# Patient Record
Sex: Male | Born: 1965 | Hispanic: No | Marital: Married | Smoking: Never smoker
Health system: Southern US, Community
[De-identification: ages and names within clinical notes are randomized; demographics above are authoritative.]

## PROBLEM LIST (undated history)

## (undated) DIAGNOSIS — T7840XA Allergy, unspecified, initial encounter: Secondary | ICD-10-CM

## (undated) DIAGNOSIS — M109 Gout, unspecified: Secondary | ICD-10-CM

## (undated) DIAGNOSIS — M1990 Unspecified osteoarthritis, unspecified site: Secondary | ICD-10-CM

## (undated) DIAGNOSIS — Z9289 Personal history of other medical treatment: Secondary | ICD-10-CM

## (undated) DIAGNOSIS — R079 Chest pain, unspecified: Secondary | ICD-10-CM

## (undated) DIAGNOSIS — G4730 Sleep apnea, unspecified: Secondary | ICD-10-CM

## (undated) HISTORY — PX: ORIF WRIST FRACTURE: SHX2133

## (undated) HISTORY — DX: Sleep apnea, unspecified: G47.30

## (undated) HISTORY — DX: Unspecified osteoarthritis, unspecified site: M19.90

## (undated) HISTORY — DX: Gout, unspecified: M10.9

## (undated) HISTORY — DX: Chest pain, unspecified: R07.9

## (undated) HISTORY — DX: Allergy, unspecified, initial encounter: T78.40XA

## (undated) HISTORY — DX: Personal history of other medical treatment: Z92.89

---

## 1987-07-09 HISTORY — PX: EXTERNAL FIXATION ARM: SHX1552

## 2008-06-17 ENCOUNTER — Encounter: Admission: RE | Admit: 2008-06-17 | Discharge: 2008-06-17 | Payer: Self-pay | Admitting: Rheumatology

## 2010-10-29 IMAGING — CT CT PELVIS W/ CM
2 of 5 series · 17 of 46 positions shown, 19 images · IV contrast (READICAT/WATER & [ID] OMNI 300)
Comparison: None

CT ABDOMEN

CLINICAL DATA: Femoral nerve neuropathy in the right lower
extremity with numbness.  Back pain

CT ABDOMEN AND PELVIS WITH CONTRAST
TECHNIQUE: Multidetector CT imaging of the abdomen and pelvis was
performed using the standard protocol following bolus
administration of intravenous contrast.
Contrast: 125 ml Vmnipaque-ZYY

[Series 3: routine abdomen · axial · 0.84mm/px · z∈[-427,-7]mm · 14 of 94 slices shown, 16 images]
[im 5/94  soft-tissue]
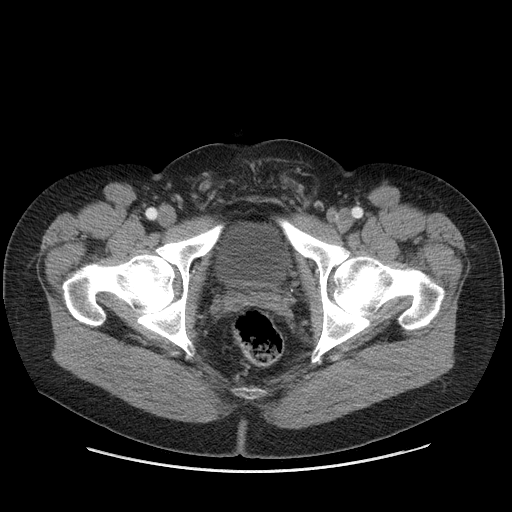
[im 5/94  bone]
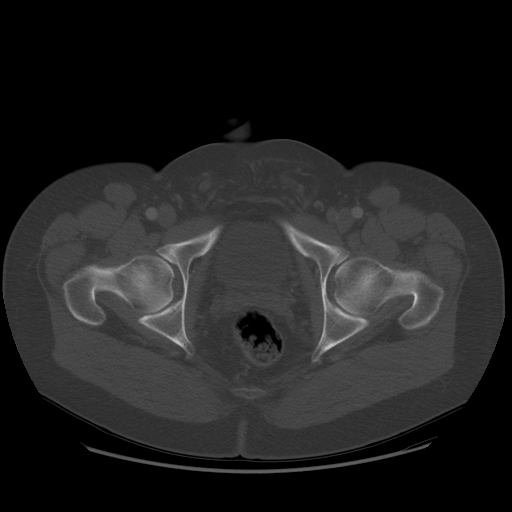
[im 10/94  soft-tissue]
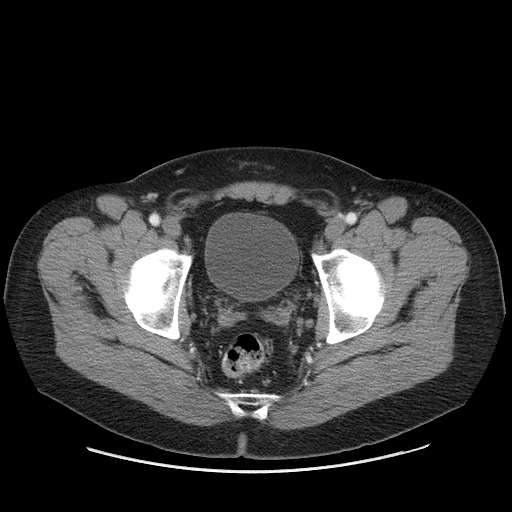
[im 20/94  soft-tissue]
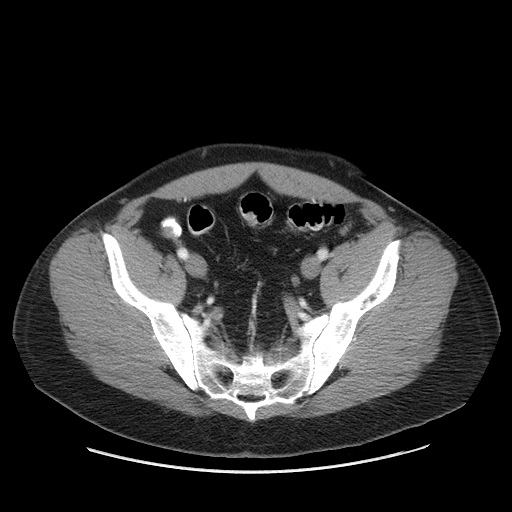
[im 25/94  soft-tissue]
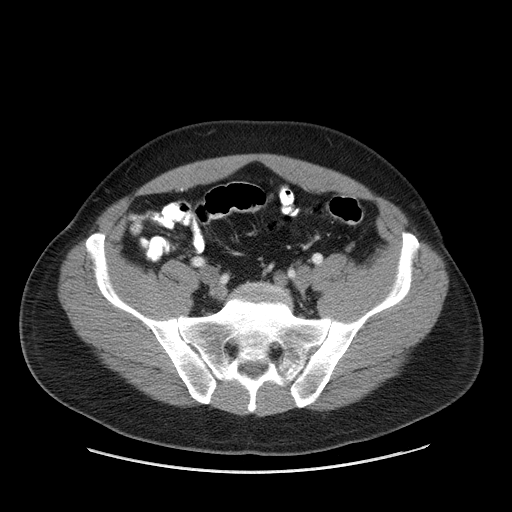
[im 30/94  soft-tissue]
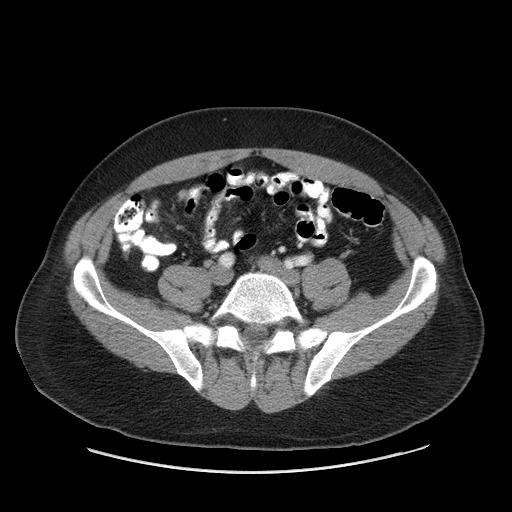
[im 40/94  soft-tissue]
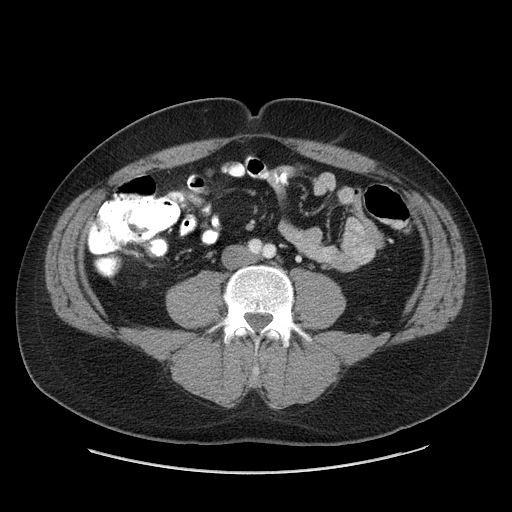
[im 45/94  soft-tissue]
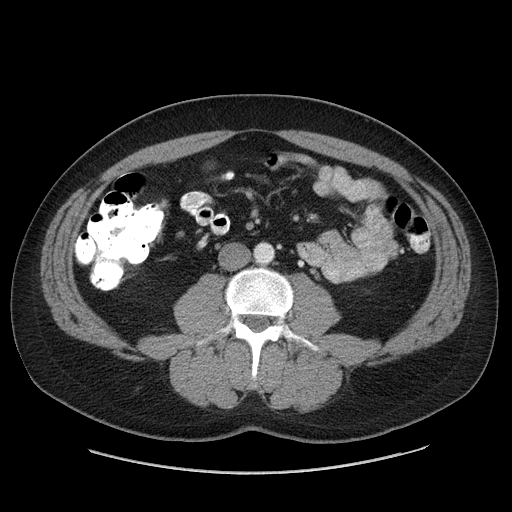
[im 49/94  soft-tissue]
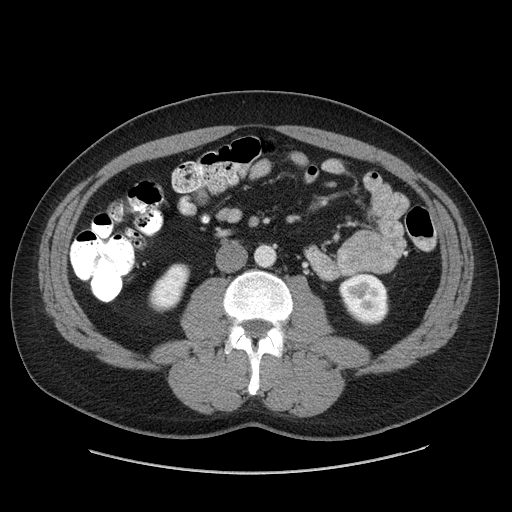
[im 54/94  soft-tissue]
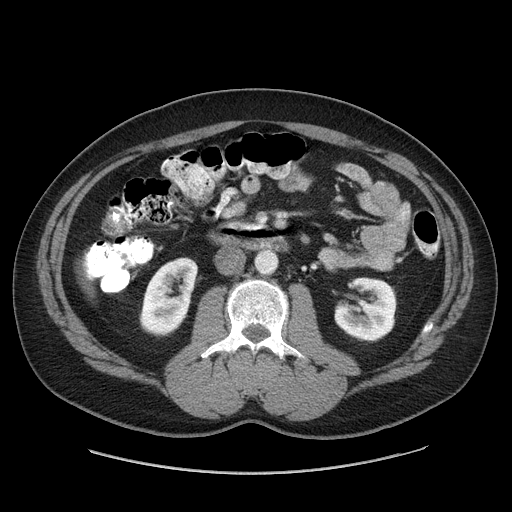
[im 54/94  bone]
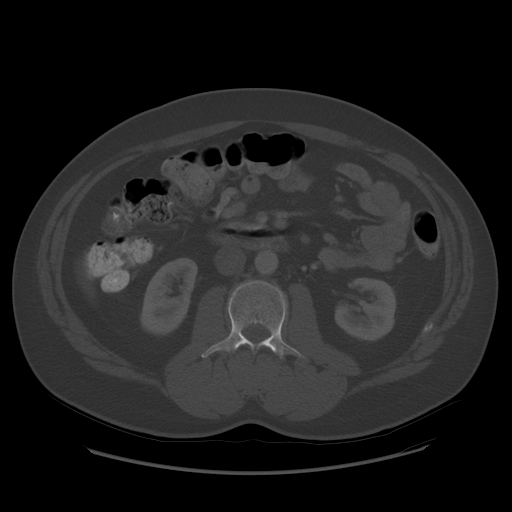
[im 64/94  soft-tissue]
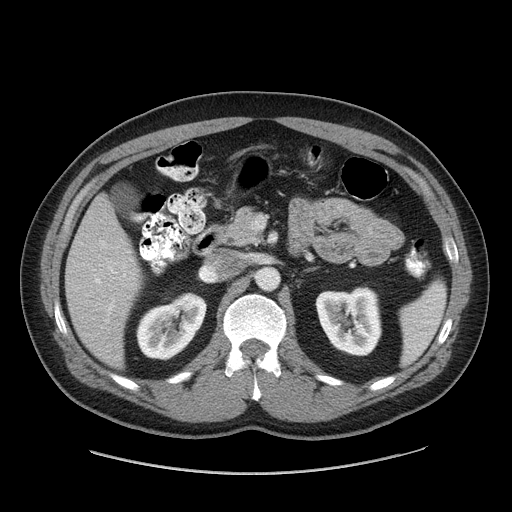
[im 69/94  soft-tissue]
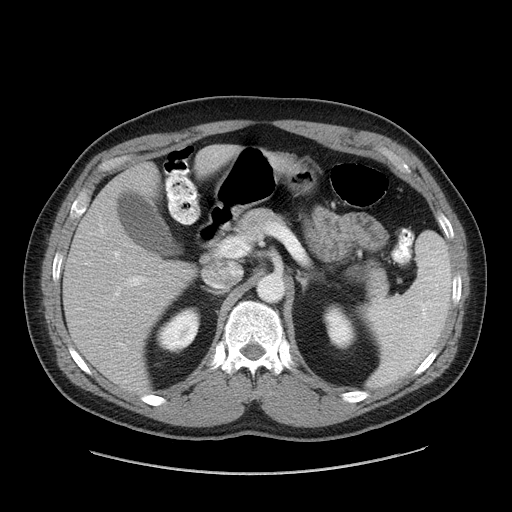
[im 74/94  soft-tissue]
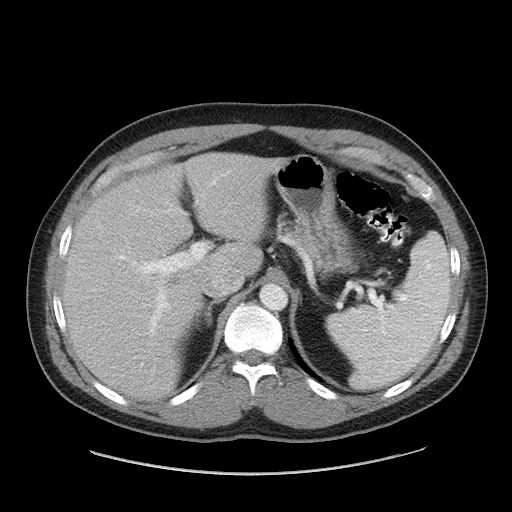
[im 84/94  soft-tissue]
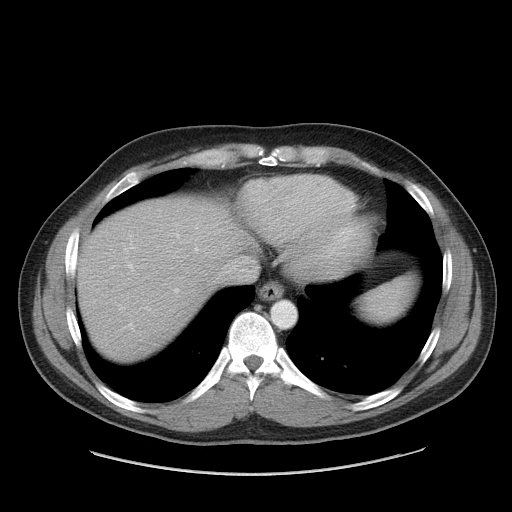
[im 89/94  soft-tissue]
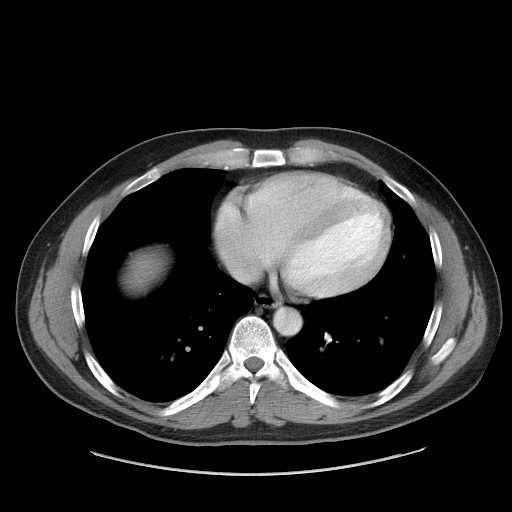

[Series 602: sagittal body · sagittal · 0.98mm/px · 3 of 173 slices shown]
[im 58/173  soft-tissue]
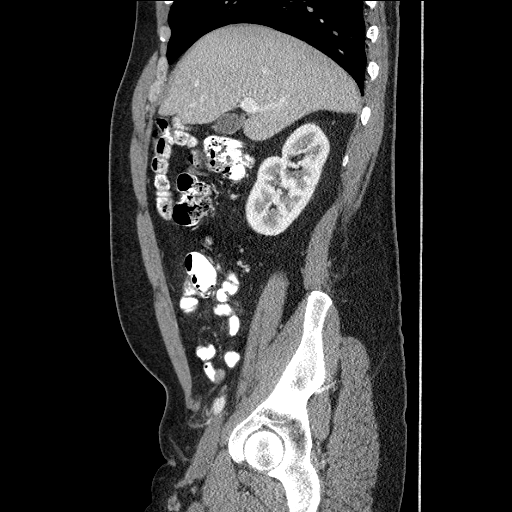
[im 77/173  soft-tissue]
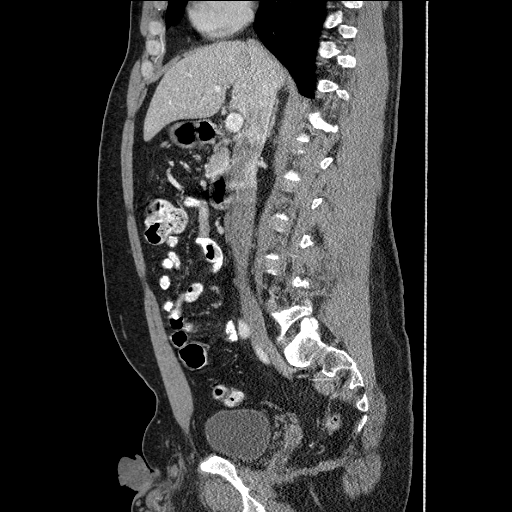
[im 96/173  soft-tissue]
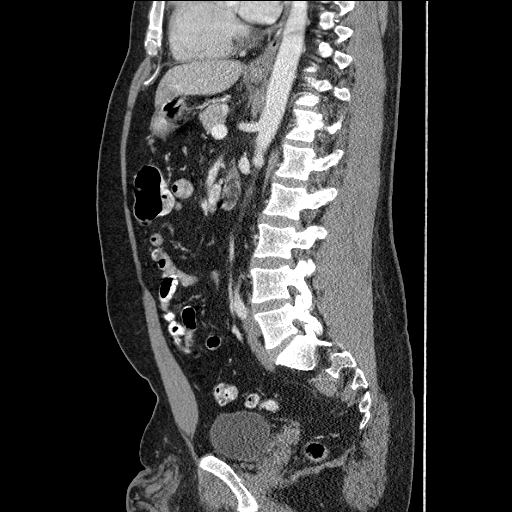

[17 of 46 positions shown; findings below may reference images not displayed]

FINDINGS: The lung bases are clear.  The liver enhances with no
focal abnormality and no ductal dilatation is seen.  No calcified
gallstones are noted.  The pancreas is normal in size and the
pancreatic duct is not dilated.  The adrenal glands and spleen
appear normal.  The kidneys enhance and on delayed images the
pelvocaliceal systems appear normal.  The abdominal aorta is normal
in caliber.  No adenopathy is seen.
IMPRESSION: Negative CT the abdomen.

CT PELVIS
FINDINGS: The urinary bladder is unremarkable.  The prostate is
normal in size.  The appendix and terminal ileum appear normal.  No
pelvic mass, adenopathy, or fluid is seen.  No abnormality is seen
within either groin.  No bony abnormality is seen.
IMPRESSION: No pelvic mass, adenopathy or fluid.

## 2012-04-13 ENCOUNTER — Ambulatory Visit: Payer: Self-pay | Admitting: Cardiology

## 2012-04-22 ENCOUNTER — Encounter: Payer: Self-pay | Admitting: *Deleted

## 2012-04-23 ENCOUNTER — Ambulatory Visit (INDEPENDENT_AMBULATORY_CARE_PROVIDER_SITE_OTHER): Payer: BC Managed Care – PPO | Admitting: Cardiology

## 2012-04-23 ENCOUNTER — Encounter: Payer: Self-pay | Admitting: Cardiology

## 2012-04-23 VITALS — BP 134/82 | HR 46 | Wt 238.0 lb

## 2012-04-23 DIAGNOSIS — R079 Chest pain, unspecified: Secondary | ICD-10-CM

## 2012-04-23 HISTORY — DX: Chest pain, unspecified: R07.9

## 2012-04-23 NOTE — Assessment & Plan Note (Signed)
Symptoms atypical and may be GI related. Note there was some improvement with a Pepcid. No exertional chest pain. Will arrange exercise treadmill for risk stratification. If negative no plans for further ischemia evaluation assuming no further symptoms.

## 2012-04-23 NOTE — Progress Notes (Signed)
  HPI: 46 yo male for evaluation of chest pain. Patient with no prior cardiac history. Approximately 3 weeks ago he had substernal chest pain. He thinks it may have been related to food that he ate. He described it as a pressure with no radiation and no associated symptoms. It would last approximately 5 minutes with slow resolution. If there is recurrent for 2 days. The pain was not pleuritic, positional, related to food, exertional and there was no associated water brash. He is had no problems since then. There is no exertional chest pain. No dyspnea on exertion, orthopnea, PND, pedal edema or syncope. He has not traveled recently nor has he injured his legs. No recent surgeries.  Current Outpatient Prescriptions  Medication Sig Dispense Refill  . allopurinol (ZYLOPRIM) 300 MG tablet Take 1 tablet by mouth Daily.      Marland Kitchen COLCRYS 0.6 MG tablet Take 1 tablet by mouth BID times 48H.      Marland Kitchen Ibuprofen (ADVIL) 200 MG CAPS Take by mouth as needed.        No Known Allergies  Past Medical History  Diagnosis Date  . Gout     No past surgical history on file.  History   Social History  . Marital Status: Married    Spouse Name: N/A    Number of Children: 2  . Years of Education: N/A   Occupational History  .      Chemist/engineer   Social History Main Topics  . Smoking status: Never Smoker   . Smokeless tobacco: Not on file  . Alcohol Use: Yes     12-15 beers per week  . Drug Use: No  . Sexually Active: Not on file   Other Topics Concern  . Not on file   Social History Narrative  . No narrative on file    No family history on file.  ROS: no fevers or chills, productive cough, hemoptysis, dysphasia, odynophagia, melena, hematochezia, dysuria, hematuria, rash, seizure activity, orthopnea, PND, pedal edema, claudication. Remaining systems are negative.  Physical Exam:   Blood pressure 134/82, pulse 46, weight 238 lb (107.956 kg).  General:  Well developed/well nourished in  NAD Skin warm/dry Patient not depressed No peripheral clubbing Back-normal HEENT-normal/normal eyelids Neck supple/normal carotid upstroke bilaterally; no bruits; no JVD; no thyromegaly chest - CTA/ normal expansion CV - RRR/normal S1 and S2; no murmurs, rubs or gallops;  PMI nondisplaced Abdomen -NT/ND, no HSM, no mass, + bowel sounds, no bruit 2+ femoral pulses, no bruits Ext-no edema, chords, 2+ DP Neuro-grossly nonfocal  ECG 03/20/2012-sinus rhythm with incomplete right bundle branch block. Inferior T-wave changes. Left ventricular hypertrophy.

## 2012-04-23 NOTE — Patient Instructions (Addendum)
Your physician recommends that you schedule a follow-up appointment in: AS NEEDED PENDING TEST RESULTS  Your physician has requested that you have an exercise tolerance test. For further information please visit [email or web address]. Please also follow instruction sheet, as given.     Exercise Stress Electrocardiography An exercise stress test is a heart test (EKG) which is done while you are moving. You will walk on a treadmill. This test will tell your doctor how your heart does when it is forced to work harder and how much activity you can safely handle. BEFORE THE TEST  Wear shorts or athletic pants.  Wear comfortable tennis shoes.  Women need to wear a bra that allows patches to be put on under it. TEST  An EKG cable will be attached to your waist. This cable is hooked up to patches, which look like round stickers stuck to your chest.  You will be asked to walk on the treadmill.  You will walk until you are too tired or until you are told to stop.  Tell the doctor right away if you have:  Chest pain.  Leg cramps.  Shortness of breath.  Dizziness.  The test may last 30 minutes to 1 hour. The timing depends on your physical condition and the condition of your heart. AFTER THE TEST  You will rest for about 6 minutes. During this time, your heart rhythm and blood pressure will be checked.  The testing equipment will be removed from your body and you can get dressed.  You may go home or back to your hospital room. You may keep doing all your usual activities as told by your doctor. Finding out the results of your test Ask when your test results will be ready. Make sure you get your test results. Document Released: 12/11/2007 Document Revised: 09/16/2011 Document Reviewed: 12/11/2007 Exit[employer] Patient Information 2013 [employer].   

## 2012-05-05 ENCOUNTER — Encounter: Payer: Self-pay | Admitting: Cardiology

## 2012-05-11 ENCOUNTER — Encounter: Payer: BC Managed Care – PPO | Admitting: Physician Assistant

## 2012-05-13 ENCOUNTER — Ambulatory Visit (INDEPENDENT_AMBULATORY_CARE_PROVIDER_SITE_OTHER): Payer: BC Managed Care – PPO | Admitting: Physician Assistant

## 2012-05-13 ENCOUNTER — Encounter: Payer: Self-pay | Admitting: Cardiology

## 2012-05-13 DIAGNOSIS — R9439 Abnormal result of other cardiovascular function study: Secondary | ICD-10-CM

## 2012-05-13 DIAGNOSIS — R079 Chest pain, unspecified: Secondary | ICD-10-CM

## 2012-05-13 NOTE — Progress Notes (Signed)
\  Exercise Treadmill Test  Pre-Exercise Testing Evaluation Rhythm: sinus bradycardia  Rate: 44   PR:  .14 QRS:  .11  QT:  .49 QTc: .42           Test  Exercise Tolerance Test Ordering MD: Olga Millers, MD  Interpreting MD: Tereso Newcomer, PA-C  Unique Test No: 1  Treadmill:  1  Indication for ETT: chest pain - rule out ischemia  Contraindication to ETT: No   Stress Modality: exercise - treadmill  Cardiac Imaging Performed: non   Protocol: standard Bruce - maximal  Max BP:  219/74  Max MPHR (bpm):  173 85% MPR (bpm):  147  MPHR obtained (bpm):  164 % MPHR obtained:  94%  Reached 85% MPHR (min:sec):  9:46 Total Exercise Time (min-sec):  11:23  Workload in METS:  13.7 Borg Scale: 17  Reason ETT Terminated:  patient's desire to stop    ST Segment Analysis At Rest: normal ST segments - no evidence of significant ST depression With Exercise: borderline ST changes  Other Information Arrhythmia:  No Angina during ETT:  absent (0) Quality of ETT:  indeterminate  ETT Interpretation:  borderline (indeterminate) with non-specific ST changes  Comments: Good exercise tolerance. No chest pain. Normal BP response to exercise. Borderline ST-T changes at peak exercise; cannot rule out ischemia.   Recommendations: Patient will be scheduled for ETT-Myoview. Tereso Newcomer, PA-C  11:52 AM 05/13/2012

## 2012-05-26 ENCOUNTER — Ambulatory Visit (HOSPITAL_COMMUNITY): Payer: BC Managed Care – PPO | Attending: Cardiology | Admitting: Radiology

## 2012-05-26 VITALS — BP 118/81 | HR 44 | Ht 74.0 in | Wt 240.0 lb

## 2012-05-26 DIAGNOSIS — R9439 Abnormal result of other cardiovascular function study: Secondary | ICD-10-CM | POA: Insufficient documentation

## 2012-05-26 DIAGNOSIS — R079 Chest pain, unspecified: Secondary | ICD-10-CM

## 2012-05-26 MED ORDER — TECHNETIUM TC 99M SESTAMIBI GENERIC - CARDIOLITE
11.0000 | Freq: Once | INTRAVENOUS | Status: AC | PRN
  Administered 2012-05-26: 11 via INTRAVENOUS

## 2012-05-26 MED ORDER — TECHNETIUM TC 99M SESTAMIBI GENERIC - CARDIOLITE
33.0000 | Freq: Once | INTRAVENOUS | Status: AC | PRN
  Administered 2012-05-26: 33 via INTRAVENOUS

## 2012-05-26 NOTE — Progress Notes (Signed)
[facility] SITE 3 NUCLEAR MED [address] [id number] [place] [address] [phone]  Cardiology Nuclear Med Study  Dylan Obrien is a 46 y.o. male     MRN : [id number]     DOB: Jul 10, 1965  Procedure Date: 05/26/2012  Nuclear Med Background Indication for Stress Test:  Evaluation for Ischemia, Abnormal EKG and GXT History:  05/13/12 OVF:IEPPIRJJOA ST T changes Cardiac Risk Factors: Overweight  Symptoms:  Chest Pressure   Nuclear Pre-Procedure Caffeine/Decaff Intake:  None NPO After: 8:00pm   Lungs:  Clear. O2 Sat: 98% on room air. IV 0.9% NS with Angio Cath:  20g  IV Site: R Antecubital  IV Started by:  Milana Na, EMT-P  Chest Size (in):  48 Cup Size: n/a  Height: 6\' 2"  (1.88 m)  Weight:  240 lb (108.863 kg)  BMI:  Body mass index is 30.81 kg/(m^2). Tech Comments: No Rx this am    Nuclear Med Study 1 or 2 day study: 1 day  Stress Test Type:  Stress  Reading MD: Marca Ancona, MD  Order Authorizing Provider:  Olga Millers, MD  Resting Radionuclide: Technetium 55m Sestamibi  Resting Radionuclide Dose: 11.0 mCi   Stress Radionuclide:  Technetium 1m Sestamibi  Stress Radionuclide Dose: 33.0 mCi           Stress Protocol Rest HR: 44 Stress HR: 153  Rest BP: 118/81 Stress BP: 216/85  Exercise Time (min): 11:00 METS: 13.4   Predicted Max HR: 173 bpm % Max HR: 88.44 bpm Rate Pressure Product: 41660   Dose of Adenosine (mg):  n/a Dose of Lexiscan: n/a mg  Dose of Atropine (mg): n/a Dose of Dobutamine: n/a mcg/kg/min (at max HR)  Stress Test Technologist: Smiley Houseman, CMA-N  Nuclear Technologist:  Domenic Polite, CNMT     Rest Procedure:  Myocardial perfusion imaging was performed at rest 45 minutes following the intravenous administration of Technetium 32m Sestamibi.  Rest ECG: Marked sinus bradycardia with IRBBB.  Stress Procedure:  The patient exercised on the treadmill utilizing the Bruce protocol for eleven minutes. He  then stopped due to fatigue and denied any chest pain.  There were marked ST-T wave changes, that quickly resolved in recovery.  He had a hypertensive response to exercise, 216/85.  Technetium 76m  Sestamibi was injected at peak exercise and myocardial perfusion imaging was performed after a brief delay.  Stress ECG: Significant ST abnormalities consistent with ischemia.  QPS Raw Data Images:  Normal; no motion artifact; normal heart/lung ratio. Stress Images:  Normal homogeneous uptake in all areas of the myocardium. Rest Images:  Normal homogeneous uptake in all areas of the myocardium. Subtraction (SDS):  There is no evidence of scar or ischemia. Transient Ischemic Dilatation (Normal <1.22):  0.97 Lung/Heart Ratio (Normal <0.45):  0.35  Quantitative Gated Spect Images QGS EDV:  173 ml QGS ESV:  73 ml  Impression Exercise Capacity:  Good exercise capacity. BP Response:  Hypertensive blood pressure response. Clinical Symptoms:  Dyspnea.  ECG Impression:  1 mm horizontal ST depression in V3-V6 at peak exercise with rapid resolution in recovery.  Comparison with Prior Nuclear Study: No previous nuclear study performed  Overall Impression:  Abnormal exercise ECG suggesting ischemia, but perfusion images do not suggest ischemia or infarction.  Probably false positive exercise ECG.   LV Ejection Fraction: 58%.  LV Wall Motion:  NL LV Function; NL Wall Motion  Marca Ancona 05/26/2012

## 2012-05-27 ENCOUNTER — Telehealth: Payer: Self-pay | Admitting: *Deleted

## 2012-05-27 ENCOUNTER — Encounter: Payer: Self-pay | Admitting: Physician Assistant

## 2012-05-27 NOTE — Telephone Encounter (Signed)
pt's wife notified about myoview normal

## 2012-05-27 NOTE — Telephone Encounter (Signed)
Message copied by Tarri Fuller on Wed May 27, 2012  3:41 PM ------      Message from: [place], [place] T      Created: Wed May 27, 2012 12:03 PM       Please inform patient stress test normal.      Tereso Newcomer, PA-C  12:03 PM 05/27/2012

## 2012-06-15 ENCOUNTER — Telehealth: Payer: Self-pay | Admitting: Cardiology

## 2012-06-15 NOTE — Telephone Encounter (Signed)
Emailed Pt ROI, He came and Picked up Records 06/12/12/KM

## 2012-07-16 ENCOUNTER — Encounter: Payer: Self-pay | Admitting: *Deleted

## 2012-07-16 DIAGNOSIS — Z9289 Personal history of other medical treatment: Secondary | ICD-10-CM | POA: Insufficient documentation

## 2012-07-16 DIAGNOSIS — M109 Gout, unspecified: Secondary | ICD-10-CM | POA: Insufficient documentation

## 2012-07-17 ENCOUNTER — Encounter: Payer: Self-pay | Admitting: Cardiology

## 2012-07-17 ENCOUNTER — Ambulatory Visit (INDEPENDENT_AMBULATORY_CARE_PROVIDER_SITE_OTHER): Payer: BC Managed Care – PPO | Admitting: Cardiology

## 2012-07-17 VITALS — BP 110/80 | HR 56 | Ht 74.0 in | Wt 246.8 lb

## 2012-07-17 DIAGNOSIS — R079 Chest pain, unspecified: Secondary | ICD-10-CM

## 2012-07-17 NOTE — Progress Notes (Signed)
   HPI: pleasant male I initially saw in October of 2013 for atypical chest pain. Exercise treadmill in November of 2013 showed borderline ST changes. Myoview in November of 2013 showed an ejection fraction of 58% and normal perfusion. The patient did have ST changes. Since that time he has not had dyspnea on exertion, orthopnea, PND, pedal edema, palpitations, syncope or exertional chest pain. He has had a "warm" sensation occasionally in his chest awakening him from sleep without associated water brash.  Current Outpatient Prescriptions  Medication Sig Dispense Refill  . allopurinol (ZYLOPRIM) 300 MG tablet Take 1 tablet by mouth Daily.      Marland Kitchen COLCRYS 0.6 MG tablet Take 1 tablet by mouth BID times 48H.      Marland Kitchen Ibuprofen (ADVIL) 200 MG CAPS Take by mouth as needed.         Past Medical History  Diagnosis Date  . Gout   . Hx of cardiovascular stress test     a. ETT 11/13 with borderline ST changes => b. ETT-Myoview:  1mm ST depression V3-6 on ECG (false +), images with no ischemia, EF 58%  . Chest pain 04/23/2012    No past surgical history on file.  History   Social History  . Marital Status: Married    Spouse Name: N/A    Number of Children: 2  . Years of Education: N/A   Occupational History  .      Chemist/engineer   Social History Main Topics  . Smoking status: Never Smoker   . Smokeless tobacco: Not on file  . Alcohol Use: Yes     Comment: 12-15 beers per week  . Drug Use: No  . Sexually Active: Not on file   Other Topics Concern  . Not on file   Social History Narrative  . No narrative on file    ROS: no fevers or chills, productive cough, hemoptysis, dysphasia, odynophagia, melena, hematochezia, dysuria, hematuria, rash, seizure activity, orthopnea, PND, pedal edema, claudication. Remaining systems are negative.  Physical Exam: Well-developed well-nourished in no acute distress.  Skin is warm and dry.  HEENT is normal.  Neck is supple.  Chest is clear to  auscultation with normal expansion.  Cardiovascular exam is regular rate and rhythm.  Abdominal exam nontender or distended. No masses palpated. Extremities show no edema. neuro grossly intact

## 2012-07-17 NOTE — Assessment & Plan Note (Signed)
Patient symptoms seem most consistent with reflux as they occur predominantly at night and are described as a warm sensation. There is some improvement with Pepcid. He has had no exertional chest pain or dyspnea on exertion. His nuclear study showed EKG changes but his perfusion was normal. I do not think we need to pursue further cardiac evaluation. Continue Pepcid.

## 2012-07-17 NOTE — Patient Instructions (Addendum)
Your physician recommends that you schedule a follow-up appointment in: AS NEEDED  

## 2015-04-14 ENCOUNTER — Encounter: Payer: Self-pay | Admitting: Internal Medicine

## 2015-08-11 ENCOUNTER — Encounter: Payer: Self-pay | Admitting: Gastroenterology

## 2015-08-21 ENCOUNTER — Ambulatory Visit (AMBULATORY_SURGERY_CENTER): Payer: Self-pay

## 2015-08-21 VITALS — Ht 74.0 in | Wt 239.0 lb

## 2015-08-21 DIAGNOSIS — Z1211 Encounter for screening for malignant neoplasm of colon: Secondary | ICD-10-CM

## 2015-08-21 MED ORDER — NA SULFATE-K SULFATE-MG SULF 17.5-3.13-1.6 GM/177ML PO SOLN: 1.0000 | Freq: Once | ORAL | Status: DC

## 2015-08-21 NOTE — Progress Notes (Signed)
No egg or soy allergies Not on home 02 No previous anesthesia complications No diet or weight loss meds 

## 2015-09-04 ENCOUNTER — Ambulatory Visit (AMBULATORY_SURGERY_CENTER): Payer: BLUE CROSS/BLUE SHIELD | Admitting: Gastroenterology

## 2015-09-04 ENCOUNTER — Encounter: Payer: Self-pay | Admitting: Gastroenterology

## 2015-09-04 VITALS — BP 97/62 | HR 43 | Temp 96.2°F | Resp 14 | Ht 74.0 in | Wt 239.0 lb

## 2015-09-04 DIAGNOSIS — Z1211 Encounter for screening for malignant neoplasm of colon: Secondary | ICD-10-CM

## 2015-09-04 MED ORDER — SODIUM CHLORIDE 0.9 % IV SOLN: 500.0000 mL | INTRAVENOUS | Status: DC

## 2015-09-04 NOTE — Patient Instructions (Signed)
YOU HAD AN ENDOSCOPIC PROCEDURE TODAY AT THE [facility]:   Refer to the procedure report that was given to you for any specific questions about what was found during the examination.  If the procedure report does not answer your questions, please call your gastroenterologist to clarify.  If you requested that your care partner not be given the details of your procedure findings, then the procedure report has been included in a sealed envelope for you to review at your convenience later.  YOU SHOULD EXPECT: Some feelings of bloating in the abdomen. Passage of more gas than usual.  Walking can help get rid of the air that was put into your GI tract during the procedure and reduce the bloating. If you had a lower endoscopy (such as a colonoscopy or flexible sigmoidoscopy) you may notice spotting of blood in your stool or on the toilet paper. If you underwent a bowel prep for your procedure, you may not have a normal bowel movement for a few days.  Please Note:  You might notice some irritation and congestion in your nose or some drainage.  This is from the oxygen used during your procedure.  There is no need for concern and it should clear up in a day or so.  SYMPTOMS TO REPORT IMMEDIATELY:   Following lower endoscopy (colonoscopy or flexible sigmoidoscopy):  Excessive amounts of blood in the stool  Significant tenderness or worsening of abdominal pains  Swelling of the abdomen that is new, acute  Fever of 100F or higher   For urgent or emergent issues, a gastroenterologist can be reached at any hour by calling [phone].   DIET: Your first meal following the procedure should be a small meal and then it is ok to progress to your normal diet. Heavy or fried foods are harder to digest and may make you feel nauseous or bloated.  Likewise, meals heavy in dairy and vegetables can increase bloating.  Drink plenty of fluids but you should avoid alcoholic beverages for 24  hours.  ACTIVITY:  You should plan to take it easy for the rest of today and you should NOT DRIVE or use heavy machinery until tomorrow (because of the sedation medicines used during the test).    FOLLOW UP: Our staff will call the number listed on your records the next business day following your procedure to check on you and address any questions or concerns that you may have regarding the information given to you following your procedure. If we do not reach you, we will leave a message.  However, if you are feeling well and you are not experiencing any problems, there is no need to return our call.  We will assume that you have returned to your regular daily activities without incident.  If any biopsies were taken you will be contacted by phone or by letter within the next 1-3 weeks.  Please call us at [phone] if you have not heard about the biopsies in 3 weeks.    SIGNATURES/CONFIDENTIALITY: You and/or your care partner have signed paperwork which will be entered into your electronic medical record.  These signatures attest to the fact that that the information above on your After Visit Summary has been reviewed and is understood.  Full responsibility of the confidentiality of this discharge information lies with you and/or your care-partner. 

## 2015-09-04 NOTE — Op Note (Signed)
[facility] [address] [place], [address]   COLONOSCOPY PROCEDURE REPORT  PATIENT: Dylan Obrien, Dylan Obrien  MR#: [id number] BIRTHDATE: Jul 15, 1965 , 49  yrs. old GENDER: male ENDOSCOPIST: Amada Jupiter, MD REFERRED FA:OZHYQ Hale Bogus, M.D. PROCEDURE DATE:  09/04/2015 PROCEDURE:   Colonoscopy, screening  ASA CLASS:   Class II INDICATIONS:Screening for colonic neoplasia and Colorectal Neoplasm Risk Assessment for this procedure is average risk. MEDICATIONS: Monitored anesthesia care and Propofol 290 mg IV  DESCRIPTION OF PROCEDURE:   After the risks benefits and alternatives of the procedure were thoroughly explained, informed consent was obtained.  The digital rectal exam revealed no abnormalities of the rectum.   The [id number]  endoscope was introduced through the anus and advanced to the cecum, which was identified by both the appendix and ileocecal valve. No adverse events experienced.   The quality of the prep was excellent. (Suprep was used)  The instrument was then slowly withdrawn as the colon was fully examined. Estimated blood loss is zero unless otherwise noted in this procedure report.      COLON FINDINGS: A normal appearing cecum, ileocecal valve, and appendiceal orifice were identified.  The ascending, transverse, descending, sigmoid colon, and rectum appeared unremarkable. No polyps were seen. Retroflexed views revealed no abnormalities. The time to cecum = 4.7 Withdrawal time = 8.9   The scope was withdrawn and the procedure completed. COMPLICATIONS: There were no immediate complications.  ENDOSCOPIC IMPRESSION: Normal colonoscopy  RECOMMENDATIONS: Repeat colonoscopy in 10 years.  eSigned:  Amada Jupiter, MD 09/04/2015 1:40 PM   cc:

## 2015-09-04 NOTE — Progress Notes (Signed)
Pt finished 8 oz of water at 1130- J Rybicki CRNA made aware

## 2015-09-04 NOTE — Progress Notes (Signed)
A/ox3, pleased with MAC, report to RN 

## 2015-09-05 ENCOUNTER — Telehealth: Payer: Self-pay

## 2015-09-05 NOTE — Telephone Encounter (Signed)
  Follow up Call-  Call back number 09/04/2015  Post procedure Call Back phone  # [phone]  Permission to leave phone message Yes     Patient was called for follow up after procedure on 09/04/2015. No answer at the number given for follow up phone call. A message was left on the answering machine.

## 2016-04-11 ENCOUNTER — Ambulatory Visit (INDEPENDENT_AMBULATORY_CARE_PROVIDER_SITE_OTHER): Payer: BLUE CROSS/BLUE SHIELD | Admitting: Rheumatology

## 2016-04-11 DIAGNOSIS — E669 Obesity, unspecified: Secondary | ICD-10-CM

## 2016-04-11 DIAGNOSIS — M25562 Pain in left knee: Secondary | ICD-10-CM

## 2016-04-11 DIAGNOSIS — M1A00X0 Idiopathic chronic gout, unspecified site, without tophus (tophi): Secondary | ICD-10-CM

## 2016-06-06 ENCOUNTER — Telehealth (INDEPENDENT_AMBULATORY_CARE_PROVIDER_SITE_OTHER): Payer: Self-pay | Admitting: Rheumatology

## 2016-06-06 MED ORDER — COLCHICINE 0.6 MG PO TABS: 0.6000 mg | ORAL_TABLET | ORAL | 2 refills | Status: DC | PRN

## 2016-06-06 NOTE — Telephone Encounter (Signed)
Patient is requesting a refill of COLCRYLS   [phone]  [facility] [address] [address]

## 2016-06-06 NOTE — Telephone Encounter (Signed)
04/11/16 last visit Next visit 06/20/16 Labs WNL 04/05/16 Ok to refill per Dr Corliss Skainseveshwar

## 2016-06-17 NOTE — Progress Notes (Deleted)
   Office Visit Note  Patient: Dylan Obrien             Date of Birth: 1966-01-08           MRN: [id number]             PCP: Garlan FillersPATERSON,DANIEL G, MD Referring: Jarome MatinPaterson, Daniel, MD Visit Date: 06/20/2016 Occupation: @GUAROCC @    Subjective:  No chief complaint on file.   History of Present Illness: Dylan Ranadward Cartaya is a 50 y.o. male ***   Activities of Daily Living:  Patient reports morning stiffness for *** {minute/hour:19697}.   Patient {ACTIONS;DENIES/REPORTS:21021675::"Denies"} nocturnal pain.  Difficulty dressing/grooming: {ACTIONS;DENIES/REPORTS:21021675::"Denies"} Difficulty climbing stairs: {ACTIONS;DENIES/REPORTS:21021675::"Denies"} Difficulty getting out of chair: {ACTIONS;DENIES/REPORTS:21021675::"Denies"} Difficulty using hands for taps, buttons, cutlery, and/or writing: {ACTIONS;DENIES/REPORTS:21021675::"Denies"}   No Rheumatology ROS completed.   PMFS History:  Patient Active Problem List   Diagnosis Date Noted  . Gout   . Hx of cardiovascular stress test   . Chest pain 04/23/2012    Past Medical History:  Diagnosis Date  . Allergy   . Arthritis   . Chest pain 04/23/2012  . Gout   . Hx of cardiovascular stress test    a. ETT 11/13 with borderline ST changes => b. ETT-Myoview:  1mm ST depression V3-6 on ECG (false +), images with no ischemia, EF 58%    Family History  Problem Relation Age of Onset  . Colon cancer Neg Hx   . Esophageal cancer Neg Hx   . Rectal cancer Neg Hx   . Stomach cancer Neg Hx    Past Surgical History:  Procedure Laterality Date  . EXTERNAL FIXATION ARM  1989   Social History   Social History Narrative  . No narrative on file     Objective: Vital Signs: There were no vitals taken for this visit.   Physical Exam   Musculoskeletal Exam: ***  CDAI Exam: No CDAI exam completed.    Investigation: No additional findings.   Imaging: No results found.  Speciality Comments: No specialty comments  available.    Procedures:  No procedures performed Allergies: Patient has no known allergies.   Assessment / Plan:     Visit Diagnoses: No diagnosis found.    Orders: No orders of the defined types were placed in this encounter.  No orders of the defined types were placed in this encounter.   Face-to-face time spent with patient was *** minutes. 50% of time was spent in counseling and coordination of care.  Follow-Up Instructions: No Follow-up on file.   Tawni PummelNaitik Karlon Schlafer, PA-C

## 2016-06-20 ENCOUNTER — Ambulatory Visit: Payer: BLUE CROSS/BLUE SHIELD | Admitting: Rheumatology

## 2016-07-10 ENCOUNTER — Telehealth: Payer: Self-pay | Admitting: Pharmacist

## 2016-07-10 NOTE — Telephone Encounter (Signed)
I received a fax from patient stating he was having concern getting his Uloric covered by his insurance.    Attempted to submit prior authorization.  Received notification that active PA was on file with expiration date of 06/03/17.    I called patient to discuss.  There was no answer.  Left message requesting patient return my call.     Lilla Shookachel Henderson, Pharm.D., BCPS Clinical Pharmacist Pager: [phone] Phone: [phone] 07/10/2016 5:35 PM

## 2016-07-11 NOTE — Telephone Encounter (Signed)
Spoke to patient.  Patient reports he has not had difficulty getting his Uloric filled.  He states he sent the fax to ensure we knew to get prior approval of the medication.  I informed him that there is an active PA on file through 06/03/17 per Cover My Meds.  Patient denies any questions or concerns at this time.  Advised patient to call us if he has any further questions or concerns.    Noted patient is due for follow up in February 2018 and does not currently have appointment scheduled.  I sent message to the front desk to schedule follow up appointment.     Lilla Shookachel Audie Stayer, Pharm.D., BCPS Clinical Pharmacist Pager: [phone] Phone: [phone] 07/11/2016 10:41 AM

## 2016-07-12 ENCOUNTER — Telehealth: Payer: Self-pay | Admitting: Rheumatology

## 2016-07-12 NOTE — Telephone Encounter (Signed)
LMOM for patient to call back to schedule appointment.  

## 2016-07-12 NOTE — Telephone Encounter (Signed)
-----   Message from Chesley MiresRachel L Henderson, [facility] sent at 07/11/2016 10:44 AM EST ----- Regarding: Needs follow up visit Patient due for follow up in February 2018.  Please call him to schedule appointment.

## 2016-09-25 ENCOUNTER — Other Ambulatory Visit (INDEPENDENT_AMBULATORY_CARE_PROVIDER_SITE_OTHER): Payer: Self-pay | Admitting: Rheumatology

## 2016-09-25 NOTE — Telephone Encounter (Signed)
Last Visit: 04/11/16 Next visit due February 2018. Message sent to front to schedule patient.  Labs: 04/08/16 WNL  Okay to refill Colcrys?

## 2016-09-25 NOTE — Telephone Encounter (Signed)
Labs due . R#[id number]

## 2016-10-10 ENCOUNTER — Other Ambulatory Visit: Payer: Self-pay | Admitting: Rheumatology

## 2016-10-10 LAB — CBC WITH DIFFERENTIAL/PLATELET
Basophils Absolute: 0 cells/uL (ref 0–200)
Basophils Relative: 0 %
Eosinophils Absolute: 200 cells/uL (ref 15–500)
Eosinophils Relative: 4 %
HCT: 40.3 % (ref 38.5–50.0)
Hemoglobin: 13.8 g/dL (ref 13.2–17.1)
Lymphocytes Relative: 27 %
Lymphs Abs: 1350 cells/uL (ref 850–3900)
MCH: 31.3 pg (ref 27.0–33.0)
MCHC: 34.2 g/dL (ref 32.0–36.0)
MCV: 91.4 fL (ref 80.0–100.0)
MPV: 10.4 fL (ref 7.5–12.5)
Monocytes Absolute: 400 cells/uL (ref 200–950)
Monocytes Relative: 8 %
Neutro Abs: 3050 cells/uL (ref 1500–7800)
Neutrophils Relative %: 61 %
Platelets: 201 10*3/uL (ref 140–400)
RBC: 4.41 MIL/uL (ref 4.20–5.80)
RDW: 13.6 % (ref 11.0–15.0)
WBC: 5 10*3/uL (ref 3.8–10.8)

## 2016-10-10 LAB — COMPLETE METABOLIC PANEL WITH GFR
ALT: 21 U/L (ref 9–46)
AST: 21 U/L (ref 10–35)
Albumin: 4.1 g/dL (ref 3.6–5.1)
Alkaline Phosphatase: 39 U/L — ABNORMAL LOW (ref 40–115)
BUN: 26 mg/dL — ABNORMAL HIGH (ref 7–25)
CO2: 28 mmol/L (ref 20–31)
Calcium: 9.1 mg/dL (ref 8.6–10.3)
Chloride: 103 mmol/L (ref 98–110)
Creat: 1.22 mg/dL (ref 0.70–1.33)
GFR, Est African American: 79 mL/min (ref >=60)
GFR, Est Non African American: 68 mL/min (ref >=60)
Glucose, Bld: 95 mg/dL (ref 65–99)
Potassium: 4 mmol/L (ref 3.5–5.3)
Sodium: 138 mmol/L (ref 135–146)
Total Bilirubin: 0.6 mg/dL (ref 0.2–1.2)
Total Protein: 6.6 g/dL (ref 6.1–8.1)

## 2016-10-11 LAB — URIC ACID: Uric Acid, Serum: 6.6 mg/dL (ref 4.0–8.0)

## 2016-10-11 NOTE — Progress Notes (Signed)
WNL

## 2016-10-14 DIAGNOSIS — M170 Bilateral primary osteoarthritis of knee: Secondary | ICD-10-CM | POA: Insufficient documentation

## 2016-10-14 DIAGNOSIS — M19042 Primary osteoarthritis, left hand: Secondary | ICD-10-CM | POA: Insufficient documentation

## 2016-10-14 DIAGNOSIS — M19041 Primary osteoarthritis, right hand: Secondary | ICD-10-CM | POA: Insufficient documentation

## 2016-10-14 NOTE — Progress Notes (Signed)
Office Visit Note  Patient: Dylan Obrien             Date of Birth: [birth year]           MRN: [id number]             PCP: Donnajean Lopes, MD Referring: Leanna Battles, MD Visit Date: 10/22/2016 Occupation: '@GUAROCC' @    Subjective:  Pain in feet.   History of Present Illness: Dylan Obrien is a 51 y.o. male history of gout and osteoarthritis. He states he has had few flares of gout since his last visit which point sided with missing has Uloric dose or drinking alcohol. These episodes resolved after taking colchicine. Last flare was about a month ago. Most the flares were in the left foot. His left knee joint is a still painful. It limits him from running.   Activities of Daily Living:  Patient reports morning stiffness for 30 minutes.   Patient Reports nocturnal pain.  Difficulty dressing/grooming: Denies Difficulty climbing stairs: Reports Difficulty getting out of chair: Denies Difficulty using hands for taps, buttons, cutlery, and/or writing: Denies   Review of Systems  Constitutional: Negative for fatigue, night sweats and weakness ( ).  HENT: Negative for mouth sores, mouth dryness and nose dryness.   Eyes: Negative for redness and dryness.  Respiratory: Negative for shortness of breath and difficulty breathing.   Cardiovascular: Negative for chest pain, palpitations, hypertension, irregular heartbeat and swelling in legs/feet.  Gastrointestinal: Negative for constipation and diarrhea.  Endocrine: Negative for increased urination.  Musculoskeletal: Positive for arthralgias, joint pain and morning stiffness. Negative for joint swelling, myalgias, muscle weakness, muscle tenderness and myalgias.  Skin: Negative for color change, rash, hair loss, nodules/bumps, skin tightness, ulcers and sensitivity to sunlight.  Allergic/Immunologic: Negative for susceptible to infections.  Neurological: Negative for dizziness, fainting, memory loss and night sweats.    Hematological: Negative for swollen glands.  Psychiatric/Behavioral: Negative for depressed mood and sleep disturbance. The patient is not nervous/anxious.     PMFS History:  Patient Active Problem List   Diagnosis Date Noted  . Primary osteoarthritis of both knees 10/14/2016  . Primary osteoarthritis of both hands 10/14/2016  . Gout   . Hx of cardiovascular stress test   . Chest pain 04/23/2012    Past Medical History:  Diagnosis Date  . Allergy   . Arthritis   . Chest pain 04/23/2012  . Gout   . Hx of cardiovascular stress test    a. ETT 11/13 with borderline ST changes => b. ETT-Myoview:  19m ST depression V3-6 on ECG (false +), images with no ischemia, EF 58%    Family History  Problem Relation Age of Onset  . Colon cancer Neg Hx   . Esophageal cancer Neg Hx   . Rectal cancer Neg Hx   . Stomach cancer Neg Hx    Past Surgical History:  Procedure Laterality Date  . EXTERNAL FIXATION ARM  1989   Social History   Social History Narrative  . No narrative on file     Objective: Vital Signs: BP 120/70   Pulse (!) 50   Resp 14   Ht '6\' 2"'  (1.88 m)   Wt 238 lb (108 kg)   BMI 30.56 kg/m    Physical Exam  Constitutional: He is oriented to person, place, and time. He appears well-developed and well-nourished.  HENT:  Head: Normocephalic and atraumatic.  Eyes: Conjunctivae and EOM are normal. Pupils are equal, round, and reactive to light.  Neck: Normal range of motion. Neck supple.  Cardiovascular: Normal rate, regular rhythm and normal heart sounds.   Pulmonary/Chest: Effort normal and breath sounds normal.  Abdominal: Soft. Bowel sounds are normal.  Neurological: He is alert and oriented to person, place, and time.  Skin: Skin is warm and dry. Capillary refill takes less than 2 seconds.  Psychiatric: He has a normal mood and affect. His behavior is normal.  Nursing note and vitals reviewed.    Musculoskeletal Exam: C-spine and thoracic lumbar spine good  range of motion. Shoulder joints although joints wrist joint MCPs PIPs DIPs with good range of motion. Is minimal thickening of DIP joints. Hip joints knee joints ankles MTPs PIPs with good range of motion. No swelling or synovitis was noted on his MTPs or DIPs.  CDAI Exam: No CDAI exam completed.    Investigation: Findings:   September 2017:  CBC, comprehensive metabolic panel was normal.  Uric acid was 5.   04/11/2016  X-ray of his left knee joint 3 views today show moderate medial compartment narrowing, no chondrocalcinosis, no patellofemoral narrowing.  These findings were consistent with moderate osteoarthritis.  Patient brought some labs today with him from 10/02/2016 CMP within normal limits, CBC within normal limits, lipid panel LDL 98 HDL 53, PSA normal  Orders Only on 10/10/2016  Component Date Value Ref Range Status  . Sodium 10/10/2016 138  135 - 146 mmol/L Final  . Potassium 10/10/2016 4.0  3.5 - 5.3 mmol/L Final  . Chloride 10/10/2016 103  98 - 110 mmol/L Final  . CO2 10/10/2016 28  20 - 31 mmol/L Final  . Glucose, Bld 10/10/2016 95  65 - 99 mg/dL Final  . BUN 10/10/2016 26* 7 - 25 mg/dL Final  . Creat 10/10/2016 1.22  0.70 - 1.33 mg/dL Final   Comment:   For patients > or = 52 years of age: The upper reference limit for Creatinine is approximately 13% higher for people identified as African-American.     . Total Bilirubin 10/10/2016 0.6  0.2 - 1.2 mg/dL Final  . Alkaline Phosphatase 10/10/2016 39* 40 - 115 U/L Final  . AST 10/10/2016 21  10 - 35 U/L Final  . ALT 10/10/2016 21  9 - 46 U/L Final  . Total Protein 10/10/2016 6.6  6.1 - 8.1 g/dL Final  . Albumin 10/10/2016 4.1  3.6 - 5.1 g/dL Final  . Calcium 10/10/2016 9.1  8.6 - 10.3 mg/dL Final  . GFR, Est African American 10/10/2016 79  >=60 mL/min Final  . GFR, Est Non African American 10/10/2016 68  >=60 mL/min Final  . WBC 10/10/2016 5.0  3.8 - 10.8 K/uL Final  . RBC 10/10/2016 4.41  4.20 - 5.80 MIL/uL Final  .  Hemoglobin 10/10/2016 13.8  13.2 - 17.1 g/dL Final  . HCT 10/10/2016 40.3  38.5 - 50.0 % Final  . MCV 10/10/2016 91.4  80.0 - 100.0 fL Final  . MCH 10/10/2016 31.3  27.0 - 33.0 pg Final  . MCHC 10/10/2016 34.2  32.0 - 36.0 g/dL Final  . RDW 10/10/2016 13.6  11.0 - 15.0 % Final  . Platelets 10/10/2016 201  140 - 400 K/uL Final  . MPV 10/10/2016 10.4  7.5 - 12.5 fL Final  . Neutro Abs 10/10/2016 3050  1,500 - 7,800 cells/uL Final  . Lymphs Abs 10/10/2016 1350  850 - 3,900 cells/uL Final  . Monocytes Absolute 10/10/2016 400  200 - 950 cells/uL Final  . Eosinophils Absolute 10/10/2016 200  15 - 500 cells/uL Final  . Basophils Absolute 10/10/2016 0  0 - 200 cells/uL Final  . Neutrophils Relative % 10/10/2016 61  % Final  . Lymphocytes Relative 10/10/2016 27  % Final  . Monocytes Relative 10/10/2016 8  % Final  . Eosinophils Relative 10/10/2016 4  % Final  . Basophils Relative 10/10/2016 0  % Final  . Smear Review 10/10/2016 Criteria for review not met   Final  . Uric Acid, Serum 10/10/2016 6.6  4.0 - 8.0 mg/dL Final     Imaging: No results found.  Speciality Comments: No specialty comments available.    Procedures:  No procedures performed Allergies: Patient has no known allergies.   Assessment / Plan:     Visit Diagnoses: Idiopathic chronic gout of multiple sites without tophus - On Uloric 80 mg by mouth daily. He states that he has  been having intermittent flares as he is missing his dose and also had been drinking alcohol. We had detailed discussion regarding gout since management diet and intake of alcohol was discussed. And also compliance with medication use was discussed.  Primary osteoarthritis of both knees - Bilateral moderate. He complains of some left knee joint discomfort no warmth or effusion or swelling was noted.  Primary osteoarthritis of both hands: Joint protection and muscle strengthening was discussed.  Lower back pain: I believe exercising and weight loss  will be helpful.  Class 1 obesity due to excess calories without serious comorbidity with body mass index (BMI) of 30.0 to 30.9 in adult : Association of heart disease with the gouty arthropathy was discussed. Weight loss diet and exercise was discussed at length.   Orders: No orders of the defined types were placed in this encounter.  No orders of the defined types were placed in this encounter.   Face-to-face time spent with patient was 30 minutes. 50% of time was spent in counseling and coordination of care.  Follow-Up Instructions: Return in about 6 months (around 04/23/2017) for Osteoarthritis, Gout.   Bo Merino, MD  Note - This record has been created using Editor, commissioning.  Chart creation errors have been sought, but may not always  have been located. Such creation errors do not reflect on  the standard of medical care.

## 2016-10-22 ENCOUNTER — Encounter: Payer: Self-pay | Admitting: Rheumatology

## 2016-10-22 ENCOUNTER — Ambulatory Visit (INDEPENDENT_AMBULATORY_CARE_PROVIDER_SITE_OTHER): Payer: BLUE CROSS/BLUE SHIELD | Admitting: Rheumatology

## 2016-10-22 VITALS — BP 120/70 | HR 50 | Resp 14 | Ht 74.0 in | Wt 238.0 lb

## 2016-10-22 DIAGNOSIS — M19041 Primary osteoarthritis, right hand: Secondary | ICD-10-CM | POA: Diagnosis not present

## 2016-10-22 DIAGNOSIS — Z6830 Body mass index (BMI) 30.0-30.9, adult: Secondary | ICD-10-CM

## 2016-10-22 DIAGNOSIS — M170 Bilateral primary osteoarthritis of knee: Secondary | ICD-10-CM | POA: Diagnosis not present

## 2016-10-22 DIAGNOSIS — M1A09X0 Idiopathic chronic gout, multiple sites, without tophus (tophi): Secondary | ICD-10-CM | POA: Diagnosis not present

## 2016-10-22 DIAGNOSIS — E6609 Other obesity due to excess calories: Secondary | ICD-10-CM

## 2016-10-22 DIAGNOSIS — M19042 Primary osteoarthritis, left hand: Secondary | ICD-10-CM | POA: Diagnosis not present

## 2016-10-22 DIAGNOSIS — Z5181 Encounter for therapeutic drug level monitoring: Secondary | ICD-10-CM | POA: Diagnosis not present

## 2016-10-22 MED ORDER — COLCHICINE 0.6 MG PO TABS: 0.6000 mg | ORAL_TABLET | ORAL | 0 refills | Status: DC | PRN

## 2016-10-22 MED ORDER — FEBUXOSTAT 80 MG PO TABS: 80.0000 mg | ORAL_TABLET | Freq: Every day | ORAL | 1 refills | Status: DC

## 2016-10-22 NOTE — Patient Instructions (Signed)
Labs due in 6 months CBC, CMP, Uric acid prior to your apoointment

## 2016-12-14 ENCOUNTER — Other Ambulatory Visit: Payer: Self-pay | Admitting: Rheumatology

## 2016-12-16 NOTE — Telephone Encounter (Signed)
Last Visit: 10/22/16 Next Visit: 04/24/17 Labs: 10/10/16 WNL  Okay to refill Colcrys?

## 2017-04-14 NOTE — Progress Notes (Deleted)
Office Visit Note  Patient: Dylan Obrien             Date of Birth: 1966/03/12           MRN: [id number]             PCP: Jarome Matin, MD Referring: Jarome Matin, MD Visit Date: 04/24/2017 Occupation: @    Subjective:  No chief complaint on file.   History of Present Illness: Dylan Obrien is a 51 y.o. male ***   Activities of Daily Living:  Patient reports morning stiffness for *** {minute/hour:19697}.   Patient {ACTIONS;DENIES/REPORTS:21021675::"Denies"} nocturnal pain.  Difficulty dressing/grooming: {ACTIONS;DENIES/REPORTS:21021675::"Denies"} Difficulty climbing stairs: {ACTIONS;DENIES/REPORTS:21021675::"Denies"} Difficulty getting out of chair: {ACTIONS;DENIES/REPORTS:21021675::"Denies"} Difficulty using hands for taps, buttons, cutlery, and/or writing: {ACTIONS;DENIES/REPORTS:21021675::"Denies"}   No Rheumatology ROS completed.   PMFS History:  Patient Active Problem List   Diagnosis Date Noted  . Primary osteoarthritis of both knees 10/14/2016  . Primary osteoarthritis of both hands 10/14/2016  . Gout   . Hx of cardiovascular stress test   . Chest pain 04/23/2012    Past Medical History:  Diagnosis Date  . Allergy   . Arthritis   . Chest pain 04/23/2012  . Gout   . Hx of cardiovascular stress test    a. ETT 11/13 with borderline ST changes => b. ETT-Myoview:  1mm ST depression V3-6 on ECG (false +), images with no ischemia, EF 58%    Family History  Problem Relation Age of Onset  . Colon cancer Neg Hx   . Esophageal cancer Neg Hx   . Rectal cancer Neg Hx   . Stomach cancer Neg Hx    Past Surgical History:  Procedure Laterality Date  . EXTERNAL FIXATION ARM  1989   Social History   Social History Narrative  . No narrative on file     Objective: Vital Signs: There were no vitals taken for this visit.   Physical Exam   Musculoskeletal Exam: ***  CDAI Exam: No CDAI exam completed.    Investigation: No additional  findings.Uric Acid 6.6 in 10/2016 CBC Latest Ref Rng & Units 10/10/2016  WBC 3.8 - 10.8 K/uL 5.0  Hemoglobin 13.2 - 17.1 g/dL 40.9  Hematocrit 81.1 - 50.0 % 40.3  Platelets 140 - 400 K/uL 201   CMP Latest Ref Rng & Units 10/10/2016  Glucose 65 - 99 mg/dL 95  BUN 7 - 25 mg/dL 91(Y)  Creatinine 7.82 - 1.33 mg/dL 9.56  Sodium 213 - 086 mmol/L 138  Potassium 3.5 - 5.3 mmol/L 4.0  Chloride 98 - 110 mmol/L 103  CO2 20 - 31 mmol/L 28  Calcium 8.6 - 10.3 mg/dL 9.1  Total Protein 6.1 - 8.1 g/dL 6.6  Total Bilirubin 0.2 - 1.2 mg/dL 0.6  Alkaline Phos 40 - 115 U/L 39(L)  AST 10 - 35 U/L 21  ALT 9 - 46 U/L 21    Imaging: No results found.  Speciality Comments: No specialty comments available.    Procedures:  No procedures performed Allergies: Patient has no known allergies.   Assessment / Plan:     Visit Diagnoses: No diagnosis found.    Orders: No orders of the defined types were placed in this encounter.  No orders of the defined types were placed in this encounter.   Face-to-face time spent with patient was *** minutes. 50% of time was spent in counseling and coordination of care.  Follow-Up Instructions: No Follow-up on file.   Ellen Henri, NT  Note -  This record has been created using [employer].  Chart creation errors have been sought, but may not always  have been located. Such creation errors do not reflect on  the standard of medical care.

## 2017-04-24 ENCOUNTER — Ambulatory Visit: Payer: BLUE CROSS/BLUE SHIELD | Admitting: Rheumatology

## 2017-08-15 ENCOUNTER — Telehealth: Payer: Self-pay | Admitting: Rheumatology

## 2017-08-15 NOTE — Telephone Encounter (Signed)
Left message to advise patient that we will be unable to fill medication until he has his appointment and labs updated. Advised patient he may call the office to have his appointment rescheduled to a sooner date.

## 2017-08-15 NOTE — Telephone Encounter (Signed)
Patient called requesting prescription refill for Uloric.  [facility] [address]

## 2017-08-29 NOTE — Progress Notes (Signed)
Office Visit Note  Patient: Dylan Obrien             Date of Birth: 06-18-1966           MRN: [id number]             PCP: Jarome Matin, MD Referring: Jarome Matin, MD Visit Date: 09/12/2017 Occupation: @GUAROCC @    Subjective:  Left elbow pain    History of Present Illness: Dylan Obrien is a 52 y.o. male with history of gout and osteoarthritis.  Patient states he continues to take Uloric 80 mg daily.  He states he has been taking Colcrys 0.6 mg as needed.  He states that about 2 months ago he developed left elbow pain that lasted about 5 weeks.  He is unsure if this was a gout flare.  He states that he had no joint swelling or warmth.  He states currently he has no joint pain or joint swelling.  He states his hands and knees have been doing good.  He denies any joint stiffness.  He states he has been flaring every few months.  He denies a history of hypertension diabetes or cardiovascular disease.        Activities of Daily Living:  Patient reports morning stiffness for 0 minutes.   Patient Denies nocturnal pain.  Difficulty dressing/grooming: Denies Difficulty climbing stairs: Denies Difficulty getting out of chair: Denies Difficulty using hands for taps, buttons, cutlery, and/or writing: Denies   Review of Systems  Constitutional: Negative for fatigue, night sweats and weakness.  HENT: Negative for mouth sores, trouble swallowing, trouble swallowing, mouth dryness and nose dryness.   Eyes: Negative for pain, redness, visual disturbance and dryness.  Respiratory: Positive for cough. Negative for hemoptysis, shortness of breath and difficulty breathing.   Cardiovascular: Negative for chest pain, palpitations, hypertension, irregular heartbeat and swelling in legs/feet.  Gastrointestinal: Negative for blood in stool, constipation and diarrhea.  Endocrine: Negative for increased urination.  Genitourinary: Negative for painful urination.  Musculoskeletal: Positive  for arthralgias and joint pain. Negative for joint swelling, myalgias, muscle weakness, morning stiffness, muscle tenderness and myalgias.  Skin: Positive for rash. Negative for color change, pallor, hair loss, nodules/bumps, redness, skin tightness, ulcers and sensitivity to sunlight.  Allergic/Immunologic: Negative for susceptible to infections.  Neurological: Negative for dizziness, fainting, memory loss and night sweats.  Hematological: Negative for swollen glands.  Psychiatric/Behavioral: Negative for depressed mood and sleep disturbance. The patient is not nervous/anxious.     PMFS History:  Patient Active Problem List   Diagnosis Date Noted  . Primary osteoarthritis of both knees 10/14/2016  . Primary osteoarthritis of both hands 10/14/2016  . Gout   . Hx of cardiovascular stress test   . Chest pain 04/23/2012    Past Medical History:  Diagnosis Date  . Allergy   . Arthritis   . Chest pain 04/23/2012  . Gout   . Hx of cardiovascular stress test    a. ETT 11/13 with borderline ST changes => b. ETT-Myoview:  1mm ST depression V3-6 on ECG (false +), images with no ischemia, EF 58%    Family History  Problem Relation Age of Onset  . Cancer Sister        brain  . Colon cancer Neg Hx   . Esophageal cancer Neg Hx   . Rectal cancer Neg Hx   . Stomach cancer Neg Hx    Past Surgical History:  Procedure Laterality Date  . EXTERNAL FIXATION ARM  1989  Social History   Social History Narrative  . Not on file     Objective: Vital Signs: BP 122/80 (BP Location: Left Arm, Patient Position: Sitting, Cuff Size: Normal)   Pulse (!) 51   Resp 15   Ht 6\' 2"  (1.88 m)   Wt 257 lb (116.6 kg)   BMI 33.00 kg/m    Physical Exam  Constitutional: He is oriented to person, place, and time. He appears well-developed and well-nourished.  HENT:  Head: Normocephalic and atraumatic.  Eyes: Conjunctivae and EOM are normal. Pupils are equal, round, and reactive to light.  Neck: Normal  range of motion. Neck supple.  Cardiovascular: Normal rate, regular rhythm and normal heart sounds.  Pulmonary/Chest: Effort normal and breath sounds normal.  Abdominal: Soft. Bowel sounds are normal.  Neurological: He is alert and oriented to person, place, and time.  Skin: Skin is warm and dry. Capillary refill takes less than 2 seconds.  Psychiatric: He has a normal mood and affect. His behavior is normal.  Nursing note and vitals reviewed.    Musculoskeletal Exam: C-spine, thoracic spine, lumbar spine good range of motion.  No midline spinal tenderness or SI joint tenderness.  Shoulder joints, elbow joints, wrist joints, MCPs, PIPs, DIPs good range of motion with no synovitis.  He has no warmth or effusion of his left elbow.  No tenderness of his left elbow on exam and no discomfort with range of motion.  Hip joints, knee joints, ankle joints, MTPs, PIPs, DIPs good range of motion with no synovitis.  No tenderness of trochanteric bursa.  No warmth or effusion of knees.  He has bilateral knee crepitus.  CDAI Exam: No CDAI exam completed.    Investigation: No additional findings. CBC Latest Ref Rng & Units 10/10/2016  WBC 3.8 - 10.8 K/uL 5.0  Hemoglobin 13.2 - 17.1 g/dL 78.2  Hematocrit 95.6 - 50.0 % 40.3  Platelets 140 - 400 K/uL 201   CMP Latest Ref Rng & Units 10/10/2016  Glucose 65 - 99 mg/dL 95  BUN 7 - 25 mg/dL 21(H)  Creatinine 0.86 - 1.33 mg/dL 5.78  Sodium 469 - 629 mmol/L 138  Potassium 3.5 - 5.3 mmol/L 4.0  Chloride 98 - 110 mmol/L 103  CO2 20 - 31 mmol/L 28  Calcium 8.6 - 10.3 mg/dL 9.1  Total Protein 6.1 - 8.1 g/dL 6.6  Total Bilirubin 0.2 - 1.2 mg/dL 0.6  Alkaline Phos 40 - 115 U/L 39(L)  AST 10 - 35 U/L 21  ALT 9 - 46 U/L 21    Imaging: No results found.  Speciality Comments: No specialty comments available.    Procedures:  No procedures performed Allergies: Patient has no known allergies.       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  He reports he has not taken Uloric in 2 months and has not been taking Colchicine.  He needs refills of both medications.  We will not recheck his uric acid level today since he has not been taking his medications. We discussed the Uloric and the recent black box warning.  He denies any history of diabetes, hypertension, or cardiovascular disease.  He would like to continue on Uloric 80 mg daily. He was advised to take Uloric 40 mg for 1 week and then increase to Uloric 80 mg daily for 1 month.  We will recheck uric acid level in 1  month.  He will take Colchicine 0.6 mg daily.  Refills of Uloric and Colchicine were sent to the pharmacy.  He was given samples of Uloric 40mg  today in the office. - Uric acid 6.6 on 10/10/16, Recheck uric acid   Primary osteoarthritis of both hands: He has mild osteoarthritic changes in his hands.  No synovitis on exam.  He has no discomfort at this time.  Joint protection and muscle strengthening were discussed.   Primary osteoarthritis of both knees: He has bilateral knee crepitus.  No warmth or effusion.     Orders: No orders of the defined types were placed in this encounter.  No orders of the defined types were placed in this encounter.     Follow-Up Instructions: No Follow-up on file.   Gearldine Bienenstockaylor M Amma Crear, PA-C   I examined and evaluated the patient with Sherron Alesaylor Thersia Petraglia PA. The plan of care was discussed as noted above.  Pollyann SavoyShaili Deveshwar, MD  Note - This record has been created using [employer] software.  Chart creation errors have been sought, but may not always  have been located. Such creation errors do not reflect on  the standard of medical care.

## 2017-09-12 ENCOUNTER — Encounter: Payer: Self-pay | Admitting: Physician Assistant

## 2017-09-12 ENCOUNTER — Ambulatory Visit (INDEPENDENT_AMBULATORY_CARE_PROVIDER_SITE_OTHER): Payer: BLUE CROSS/BLUE SHIELD | Admitting: Physician Assistant

## 2017-09-12 VITALS — BP 122/80 | HR 51 | Resp 15 | Ht 74.0 in | Wt 257.0 lb

## 2017-09-12 DIAGNOSIS — M170 Bilateral primary osteoarthritis of knee: Secondary | ICD-10-CM

## 2017-09-12 DIAGNOSIS — M19041 Primary osteoarthritis, right hand: Secondary | ICD-10-CM

## 2017-09-12 DIAGNOSIS — M1A09X0 Idiopathic chronic gout, multiple sites, without tophus (tophi): Secondary | ICD-10-CM | POA: Diagnosis not present

## 2017-09-12 DIAGNOSIS — M19042 Primary osteoarthritis, left hand: Secondary | ICD-10-CM | POA: Diagnosis not present

## 2017-09-12 MED ORDER — FEBUXOSTAT 40 MG PO TABS: 40.0000 mg | ORAL_TABLET | Freq: Every day | ORAL | 0 refills | Status: DC

## 2017-09-12 MED ORDER — FEBUXOSTAT 80 MG PO TABS: 80.0000 mg | ORAL_TABLET | Freq: Every day | ORAL | 1 refills | Status: DC

## 2017-09-12 MED ORDER — COLCHICINE 0.6 MG PO CAPS: 0.6000 mg | ORAL_CAPSULE | Freq: Every day | ORAL | 5 refills | Status: DC | PRN

## 2017-12-05 ENCOUNTER — Telehealth: Payer: Self-pay | Admitting: Rheumatology

## 2017-12-05 NOTE — Telephone Encounter (Signed)
[facility] [address]. Patient did not pick up original rx several months ago to start meds, but is now ready to start, and pharmacy does not have original rx anymore per patient.

## 2017-12-08 ENCOUNTER — Telehealth: Payer: Self-pay

## 2017-12-08 MED ORDER — FEBUXOSTAT 40 MG PO TABS: 40.0000 mg | ORAL_TABLET | Freq: Every day | ORAL | 0 refills | Status: DC

## 2017-12-08 MED ORDER — FEBUXOSTAT 80 MG PO TABS: 80.0000 mg | ORAL_TABLET | Freq: Every day | ORAL | 1 refills | Status: DC

## 2017-12-08 NOTE — Telephone Encounter (Signed)
Last Visit: 09/12/17 Next visit: 09/13/18 Labs: 10/10/17 WNL  Okay to refill per Dr.Deveshwar

## 2017-12-08 NOTE — Telephone Encounter (Signed)
[facility] a [facility]. Authorization was submitted to patient's insurance via Cover My Meds. Authorization approved from 11/08/2017 through 12/08/2018.   Authorization #: [id number]  Will send document to scan center once received.

## 2018-03-12 ENCOUNTER — Institutional Professional Consult (permissible substitution): Payer: BLUE CROSS/BLUE SHIELD | Admitting: Neurology

## 2018-04-20 ENCOUNTER — Telehealth: Payer: Self-pay

## 2018-04-20 ENCOUNTER — Encounter: Payer: Self-pay | Admitting: Neurology

## 2018-04-20 ENCOUNTER — Institutional Professional Consult (permissible substitution): Payer: BLUE CROSS/BLUE SHIELD | Admitting: Neurology

## 2018-04-20 NOTE — Telephone Encounter (Signed)
Pt did not show for their appt with Dr. Athar today.  

## 2018-08-31 NOTE — Progress Notes (Deleted)
Office Visit Note  Patient: Dylan Obrien             Date of Birth: 11-22-1965           MRN: [id number]             PCP: Jarome Matin, MD Referring: Jarome Matin, MD Visit Date: 09/14/2018 Occupation: @GUAROCC @  Subjective:  No chief complaint on file.   History of Present Illness: Dylan Obrien is a 53 y.o. male ***   Activities of Daily Living:  Patient reports morning stiffness for *** {minute/hour:19697}.   Patient {ACTIONS;DENIES/REPORTS:21021675::"Denies"} nocturnal pain.  Difficulty dressing/grooming: {ACTIONS;DENIES/REPORTS:21021675::"Denies"} Difficulty climbing stairs: {ACTIONS;DENIES/REPORTS:21021675::"Denies"} Difficulty getting out of chair: {ACTIONS;DENIES/REPORTS:21021675::"Denies"} Difficulty using hands for taps, buttons, cutlery, and/or writing: {ACTIONS;DENIES/REPORTS:21021675::"Denies"}  No Rheumatology ROS completed.   PMFS History:  Patient Active Problem List   Diagnosis Date Noted  . Primary osteoarthritis of both knees 10/14/2016  . Primary osteoarthritis of both hands 10/14/2016  . Gout   . Hx of cardiovascular stress test   . Chest pain 04/23/2012    Past Medical History:  Diagnosis Date  . Allergy   . Arthritis   . Chest pain 04/23/2012  . Gout   . Hx of cardiovascular stress test    a. ETT 11/13 with borderline ST changes => b. ETT-Myoview:  18mm ST depression V3-6 on ECG (false +), images with no ischemia, EF 58%    Family History  Problem Relation Age of Onset  . Cancer Sister        brain  . Colon cancer Neg Hx   . Esophageal cancer Neg Hx   . Rectal cancer Neg Hx   . Stomach cancer Neg Hx    Past Surgical History:  Procedure Laterality Date  . EXTERNAL FIXATION ARM  1989   Social History   Social History Narrative  . Not on file    There is no immunization history on file for this patient.   Objective: Vital Signs: There were no vitals taken for this visit.   Physical Exam   Musculoskeletal Exam:  ***  CDAI Exam: CDAI Score: Not documented Patient Global Assessment: Not documented; Provider Global Assessment: Not documented Swollen: Not documented; Tender: Not documented Joint Exam   Not documented   There is currently no information documented on the homunculus. Go to the Rheumatology activity and complete the homunculus joint exam.  Investigation: No additional findings.  Imaging: No results found.  Recent Labs: Lab Results  Component Value Date   WBC 5.0 10/10/2016   HGB 13.8 10/10/2016   PLT 201 10/10/2016   NA 138 10/10/2016   K 4.0 10/10/2016   CL 103 10/10/2016   CO2 28 10/10/2016   GLUCOSE 95 10/10/2016   BUN 26 (H) 10/10/2016   CREATININE 1.22 10/10/2016   BILITOT 0.6 10/10/2016   ALKPHOS 39 (L) 10/10/2016   AST 21 10/10/2016   ALT 21 10/10/2016   PROT 6.6 10/10/2016   ALBUMIN 4.1 10/10/2016   CALCIUM 9.1 10/10/2016   GFRAA 79 10/10/2016    Speciality Comments: No specialty comments available.  Procedures:  No procedures performed Allergies: Patient has no known allergies.   Assessment / Plan:     Visit Diagnoses: No diagnosis found.   Orders: No orders of the defined types were placed in this encounter.  No orders of the defined types were placed in this encounter.   Face-to-face time spent with patient was *** minutes. Greater than 50% of time was spent in counseling and  coordination of care.  Follow-Up Instructions: No follow-ups on file.   Earnestine Mealing, CMA  Note - This record has been created using Editor, commissioning.  Chart creation errors have been sought, but may not always  have been located. Such creation errors do not reflect on  the standard of medical care.

## 2018-09-14 ENCOUNTER — Ambulatory Visit: Payer: BLUE CROSS/BLUE SHIELD | Admitting: Rheumatology

## 2018-09-15 ENCOUNTER — Telehealth: Payer: Self-pay | Admitting: Rheumatology

## 2018-09-15 DIAGNOSIS — M1A09X0 Idiopathic chronic gout, multiple sites, without tophus (tophi): Secondary | ICD-10-CM

## 2018-09-15 NOTE — Progress Notes (Signed)
Office Visit Note  Patient: Dylan Obrien             Date of Birth: Jan 16, 1966           MRN: [id number]             PCP: Jarome Matin, MD Referring: Jarome Matin, MD Visit Date: 09/16/2018 Occupation: @GUAROCC @  Subjective:  Discuss medication options   History of Present Illness: Dylan Obrien is a 53 y.o. male with history of gout and osteoarthritis. Patient reports bilateral knee and shoulder pain. He takes Advil which provides relief for his knee pain. He denies other joint pain or any joint swelling. He denies any gout flares.  His last flare was 2 years ago. Patient reports he hasn't been taking Uloric since October 2019 due to being uninsured but has insurance now. He was previously on Allopurinol 300 mg po daily but did not feel as though it was as effective. He denies any joint stiffness. He has questions about Uloric vs. Allopurinol.     Activities of Daily Living:  Patient reports morning stiffness for 0 minutes.   Patient Denies nocturnal pain.  Difficulty dressing/grooming: Denies Difficulty climbing stairs: Reports Difficulty getting out of chair: Denies Difficulty using hands for taps, buttons, cutlery, and/or writing: Denies  Review of Systems  Constitutional: Positive for fatigue. Negative for night sweats.  HENT: Negative for mouth sores, mouth dryness and nose dryness.   Eyes: Negative for redness and dryness.  Respiratory: Negative for cough, hemoptysis, shortness of breath and difficulty breathing.   Cardiovascular: Negative for chest pain, palpitations, hypertension, irregular heartbeat and swelling in legs/feet.  Gastrointestinal: Negative for blood in stool, constipation and diarrhea.  Endocrine: Negative for excessive thirst and increased urination.  Genitourinary: Negative for difficulty urinating and painful urination.  Musculoskeletal: Positive for arthralgias and joint pain. Negative for gait problem, joint swelling, myalgias, muscle  weakness, morning stiffness, muscle tenderness and myalgias.  Skin: Negative for color change, rash, hair loss, nodules/bumps, skin tightness, ulcers and sensitivity to sunlight.  Allergic/Immunologic: Negative for susceptible to infections.  Neurological: Negative for dizziness, fainting, memory loss, night sweats and weakness.  Hematological: Negative for bruising/bleeding tendency and swollen glands.  Psychiatric/Behavioral: Negative for depressed mood and sleep disturbance. The patient is not nervous/anxious.     PMFS History:  Patient Active Problem List   Diagnosis Date Noted  . Primary osteoarthritis of both knees 10/14/2016  . Primary osteoarthritis of both hands 10/14/2016  . Gout   . Hx of cardiovascular stress test   . Chest pain 04/23/2012    Past Medical History:  Diagnosis Date  . Allergy   . Arthritis   . Chest pain 04/23/2012  . Gout   . Hx of cardiovascular stress test    a. ETT 11/13 with borderline ST changes => b. ETT-Myoview:  16mm ST depression V3-6 on ECG (false +), images with no ischemia, EF 58%    Family History  Problem Relation Age of Onset  . Cancer Sister        brain  . Colon cancer Neg Hx   . Esophageal cancer Neg Hx   . Rectal cancer Neg Hx   . Stomach cancer Neg Hx    Past Surgical History:  Procedure Laterality Date  . EXTERNAL FIXATION ARM  1989   Social History   Social History Narrative  . Not on file    There is no immunization history on file for this patient.   Objective: Vital Signs: BP  118/82 (BP Location: Right Arm, Patient Position: Sitting, Cuff Size: Large)   Pulse (!) 48   Ht  (1.88 m)   Wt 255 lb (115.7 kg)   BMI 32.74 kg/m    Physical Exam Vitals signs and nursing note reviewed.  Constitutional:      Appearance: He is well-developed.  HENT:     Head: Normocephalic and atraumatic.  Eyes:     Conjunctiva/sclera: Conjunctivae normal.     Pupils: Pupils are equal, round, and reactive to light.  Neck:      Musculoskeletal: Normal range of motion and neck supple.  Cardiovascular:     Rate and Rhythm: Normal rate and regular rhythm.     Heart sounds: Normal heart sounds.  Pulmonary:     Effort: Pulmonary effort is normal.     Breath sounds: Normal breath sounds.  Abdominal:     General: Bowel sounds are normal.     Palpations: Abdomen is soft.  Lymphadenopathy:     Cervical: No cervical adenopathy.  Skin:    General: Skin is warm and dry.     Capillary Refill: Capillary refill takes less than 2 seconds.  Neurological:     Mental Status: He is alert and oriented to person, place, and time.  Psychiatric:        Behavior: Behavior normal.      Musculoskeletal Exam: C-spine, thoracic and lumbar spine in good range of motion.  No midline spinal tenderness.  No SI joint tenderness. Shoulder joints, elbow joints, wrist joints, MCPs, PIPs, DIPs in good range of motion with no synovitis. Minimal DIP synovial thickening noted. Hip joints, knee joints, ankle joints, MTPs, PIPs and DIPs in good range of motion with no synovitis. No warmth or effusion of bilateral knee joints.  Left knee joint crepitus. No tophi present.   CDAI Exam: CDAI Score: Not documented Patient Global Assessment: Not documented; Provider Global Assessment: Not documented Swollen: Not documented; Tender: Not documented Joint Exam   Not documented   There is currently no information documented on the homunculus. Go to the Rheumatology activity and complete the homunculus joint exam.  Investigation: No additional findings.  Imaging: No results found.  Recent Labs: Lab Results  Component Value Date   WBC 6.0 09/15/2018   HGB 15.3 09/15/2018   PLT 224 09/15/2018   NA 138 09/15/2018   K 3.9 09/15/2018   CL 101 09/15/2018   CO2 27 09/15/2018   GLUCOSE 93 09/15/2018   BUN 24 09/15/2018   CREATININE 1.15 09/15/2018   BILITOT 1.0 09/15/2018   ALKPHOS 39 (L) 10/10/2016   AST 21 09/15/2018   ALT 22 09/15/2018    PROT 7.4 09/15/2018   ALBUMIN 4.1 10/10/2016   CALCIUM 9.6 09/15/2018   GFRAA 84 09/15/2018    Speciality Comments: No specialty comments available.  Procedures:  No procedures performed Allergies: Patient has no known allergies.   Assessment / Plan:     Visit Diagnoses: Idiopathic chronic gout of multiple sites without tophus - He has not had any recent gout flares.  His last flare was about 2 years ago.  He has been off of Uloric since October 2019 due to being uninsured.  His uric acid level was 8.9 on 09/15/18.  He is apprehensive to restart on Uloric due to the black box warning.  He has been on allopurinol 300 mg in the past but did not feel as though it was as effective.  He will restart on Allopurinol 150 mg  for 1 week and take colchicine 0.6 mg po daily. After 1 week he will increase to Allopurinol 300 mg po daily and continue taking colchicine 0.6 mg 1 tablet daily to prevent a flare. Prescriptions for allopurinol and colchicine will be sent to the pharmacy. We will recheck uric acid in 1 month. Future order placed today. He was advised to notify us if he develops any signs or symptoms of a flare.  He will follow up in 3 months.   Primary osteoarthritis of both hands: He has mild PIP and DIP synovial thickening consistent with osteoarthritis of both hands. No synovitis or tenderness.  Joint protection and muscle strengthening were discussed.   Primary osteoarthritis of both knees : No warmth or effusion of knee joints.  Good ROM with no discomfort. He has occasional bilateral knee joints pain that is resolved after taking Advil.   Orders: No orders of the defined types were placed in this encounter.  No orders of the defined types were placed in this encounter.    Follow-Up Instructions: Return in about 3 months (around 12/17/2018) for Gout, Osteoarthritis.   Gearldine Bienenstock, PA-C   I examined and evaluated the patient with Sherron Ales PA.  Patient has been off Uloric for the  last 6 months.  He has not had a gout flare in 2 years.  His most recent uric acid was elevated.  We had detailed discussion between Uloric and allopurinol.  He feels more comfortable being on allopurinol.  We had detailed discussion regarding the dosing of allopurinol.  He will start on colchicine and allopurinol starting at 250 mg for 1 week and then increase it to 300 mg p.o. daily.  He will have labs in 1 month.  If his uric acid is not adequate we will increase his dose.  The plan of care was discussed as noted above.  Pollyann Savoy, MD  Note - This record has been created using Animal nutritionist.  Chart creation errors have been sought, but may not always  have been located. Such creation errors do not reflect on  the standard of medical care.

## 2018-09-15 NOTE — Telephone Encounter (Signed)
Patient left a voicemail requesting prescription refill of Colchicine to be sent to [facility] [address] [facility].

## 2018-09-16 ENCOUNTER — Encounter: Payer: Self-pay | Admitting: Rheumatology

## 2018-09-16 ENCOUNTER — Other Ambulatory Visit: Payer: Self-pay

## 2018-09-16 ENCOUNTER — Ambulatory Visit: Payer: BLUE CROSS/BLUE SHIELD | Admitting: Rheumatology

## 2018-09-16 VITALS — BP 118/82 | HR 48 | Ht 74.0 in | Wt 255.0 lb

## 2018-09-16 DIAGNOSIS — M19041 Primary osteoarthritis, right hand: Secondary | ICD-10-CM

## 2018-09-16 DIAGNOSIS — M170 Bilateral primary osteoarthritis of knee: Secondary | ICD-10-CM | POA: Diagnosis not present

## 2018-09-16 DIAGNOSIS — M19042 Primary osteoarthritis, left hand: Secondary | ICD-10-CM

## 2018-09-16 DIAGNOSIS — M1A09X0 Idiopathic chronic gout, multiple sites, without tophus (tophi): Secondary | ICD-10-CM | POA: Diagnosis not present

## 2018-09-16 LAB — CBC WITH DIFFERENTIAL/PLATELET
Absolute Monocytes: 540 cells/uL (ref 200–950)
Basophils Absolute: 30 cells/uL (ref 0–200)
Basophils Relative: 0.5 %
Eosinophils Absolute: 72 cells/uL (ref 15–500)
Eosinophils Relative: 1.2 %
HCT: 44 % (ref 38.5–50.0)
Hemoglobin: 15.3 g/dL (ref 13.2–17.1)
Lymphs Abs: 1266 cells/uL (ref 850–3900)
MCH: 31.9 pg (ref 27.0–33.0)
MCHC: 34.8 g/dL (ref 32.0–36.0)
MCV: 91.7 fL (ref 80.0–100.0)
MPV: 10.6 fL (ref 7.5–12.5)
Monocytes Relative: 9 %
Neutro Abs: 4092 cells/uL (ref 1500–7800)
Neutrophils Relative %: 68.2 %
Platelets: 224 10*3/uL (ref 140–400)
RBC: 4.8 10*6/uL (ref 4.20–5.80)
RDW: 13.3 % (ref 11.0–15.0)
Total Lymphocyte: 21.1 %
WBC: 6 10*3/uL (ref 3.8–10.8)

## 2018-09-16 LAB — COMPLETE METABOLIC PANEL WITH GFR
AG Ratio: 1.6 (calc) (ref 1.0–2.5)
ALT: 22 U/L (ref 9–46)
AST: 21 U/L (ref 10–35)
Albumin: 4.6 g/dL (ref 3.6–5.1)
Alkaline phosphatase (APISO): 47 U/L (ref 35–144)
BUN: 24 mg/dL (ref 7–25)
CO2: 27 mmol/L (ref 20–32)
Calcium: 9.6 mg/dL (ref 8.6–10.3)
Chloride: 101 mmol/L (ref 98–110)
Creat: 1.15 mg/dL (ref 0.70–1.33)
GFR, Est African American: 84 mL/min/{1.73_m2} (ref >=60)
GFR, Est Non African American: 72 mL/min/{1.73_m2} (ref >=60)
Globulin: 2.8 g/dL (calc) (ref 1.9–3.7)
Glucose, Bld: 93 mg/dL (ref 65–99)
Potassium: 3.9 mmol/L (ref 3.5–5.3)
Sodium: 138 mmol/L (ref 135–146)
Total Bilirubin: 1 mg/dL (ref 0.2–1.2)
Total Protein: 7.4 g/dL (ref 6.1–8.1)

## 2018-09-16 LAB — URIC ACID: Uric Acid, Serum: 8.9 mg/dL — ABNORMAL HIGH (ref 4.0–8.0)

## 2018-09-16 MED ORDER — ALLOPURINOL 300 MG PO TABS: ORAL_TABLET | ORAL | 0 refills | Status: DC

## 2018-09-16 MED ORDER — COLCHICINE 0.6 MG PO CAPS: 0.6000 mg | ORAL_CAPSULE | Freq: Every day | ORAL | 5 refills | Status: DC | PRN

## 2018-09-16 NOTE — Telephone Encounter (Signed)
We will discuss results at the follow-up visit.

## 2018-09-16 NOTE — Patient Instructions (Signed)
Standing Labs We placed an order today for your standing lab work.    Please come back and get your standing labs in 1 month  We have open lab Monday through Friday from 8:30-11:30 AM and 1:30-4:00 PM  at the office of Dr. Pollyann Savoy.   You may experience shorter wait times on Monday and Friday afternoons. The office is located at [address], [address], [place], [place] [address] No appointment is necessary.   Labs are drawn by [facility].  You may receive a bill from [facility] for your lab work.  If you wish to have your labs drawn at another location, please call the office 24 hours in advance to send orders.  If you have any questions regarding directions or hours of operation,  please call [phone].   Just as a reminder please drink plenty of water prior to coming for your lab work. Thanks!

## 2018-09-16 NOTE — Telephone Encounter (Signed)
Prescription to be refilled at appointment on 09/16/18.

## 2018-10-31 ENCOUNTER — Other Ambulatory Visit: Payer: Self-pay | Admitting: Physician Assistant

## 2018-11-02 NOTE — Telephone Encounter (Signed)
Last Visit: 09/16/2018 Next Visit: 12/16/2018 Labs: 09/15/2018 WNL  Uric acid: 09/15/2018 8.9  Okay to refill per Dr. Corliss Skains.

## 2018-11-02 NOTE — Telephone Encounter (Signed)
Ok to refill 

## 2018-12-03 NOTE — Progress Notes (Signed)
Office Visit Note  Patient: Dylan Obrien             Date of Birth: 08-Apr-1966           MRN: [id number]             PCP: Jarome Matin, MD Referring: Jarome Matin, MD Visit Date: 12/16/2018 Occupation: @  Subjective:  Pain in both knees.   History of Present Illness: Dylan Obrien is a 53 y.o. male for for gout and osteoarthritis.  He states he ran out of allopurinol for about 3 weeks and then restarted last week.  He is currently on 150 mg dose for the 1 week and then he will increase it to 300 mg a day.  He states he has not had any gout flares since the last visit.  He had mild tenderness in his elbow which is gone now.  He states he has been having chronic bilateral knee joint pain for several years which has been getting worse.  He states recently he has been having nocturnal pain.  He has difficulty climbing stairs and doing routine activities.  He would like to have cortisone injection in his knee joints.  He has some stiffness in his hands which is tolerable.  He denies any joint swelling.  Activities of Daily Living:  Patient reports morning stiffness for 0 none.   Patient Reports nocturnal pain.  Difficulty dressing/grooming: Denies Difficulty climbing stairs: Reports Difficulty getting out of chair: Denies Difficulty using hands for taps, buttons, cutlery, and/or writing: Denies  Review of Systems  Constitutional: Negative for fatigue and night sweats.  HENT: Negative for mouth sores, mouth dryness and nose dryness.   Eyes: Negative for redness and dryness.  Respiratory: Negative for shortness of breath and difficulty breathing.   Cardiovascular: Negative for chest pain, palpitations, hypertension, irregular heartbeat and swelling in legs/feet.  Gastrointestinal: Negative for constipation and diarrhea.  Endocrine: Negative for increased urination.  Genitourinary: Negative for difficulty urinating.  Musculoskeletal: Positive for arthralgias and  joint pain. Negative for joint swelling, myalgias, muscle weakness, morning stiffness, muscle tenderness and myalgias.  Skin: Negative for color change, rash, hair loss, nodules/bumps, skin tightness, ulcers and sensitivity to sunlight.  Allergic/Immunologic: Negative for susceptible to infections.  Neurological: Negative for dizziness, fainting, memory loss, night sweats and weakness.  Hematological: Negative for bruising/bleeding tendency and swollen glands.  Psychiatric/Behavioral: Negative for depressed mood and sleep disturbance. The patient is not nervous/anxious.     PMFS History:  Patient Active Problem List   Diagnosis Date Noted   Primary osteoarthritis of both knees 10/14/2016   Primary osteoarthritis of both hands 10/14/2016   Gout    Hx of cardiovascular stress test    Chest pain 04/23/2012    Past Medical History:  Diagnosis Date   Allergy    Arthritis    Chest pain 04/23/2012   Gout    Hx of cardiovascular stress test    a. ETT 11/13 with borderline ST changes => b. ETT-Myoview:  1mm ST depression V3-6 on ECG (false +), images with no ischemia, EF 58%    Family History  Problem Relation Age of Onset   Cancer Sister        brain   Colon cancer Neg Hx    Esophageal cancer Neg Hx    Rectal cancer Neg Hx    Stomach cancer Neg Hx    Past Surgical History:  Procedure Laterality Date   EXTERNAL FIXATION ARM  1989   Social  History   Social History Narrative   Not on file    There is no immunization history on file for this patient.   Objective: Vital Signs: BP 107/63 (BP Location: Left Arm, Patient Position: Sitting, Cuff Size: Normal)    Pulse (!) 46    Resp 14    Ht 6\' 2"  (1.88 m)    Wt 254 lb 9.6 oz (115.5 kg)    BMI 32.69 kg/m    Physical Exam Vitals signs and nursing note reviewed.  Constitutional:      Appearance: He is well-developed.  HENT:     Head: Normocephalic and atraumatic.  Eyes:     Conjunctiva/sclera: Conjunctivae  normal.     Pupils: Pupils are equal, round, and reactive to light.  Neck:     Musculoskeletal: Normal range of motion and neck supple.  Cardiovascular:     Rate and Rhythm: Normal rate and regular rhythm.     Heart sounds: Normal heart sounds.  Pulmonary:     Effort: Pulmonary effort is normal.     Breath sounds: Normal breath sounds.  Abdominal:     General: Bowel sounds are normal.     Palpations: Abdomen is soft.  Skin:    General: Skin is warm and dry.     Capillary Refill: Capillary refill takes less than 2 seconds.  Neurological:     Mental Status: He is alert and oriented to person, place, and time.  Psychiatric:        Behavior: Behavior normal.      Musculoskeletal Exam: C-spine thoracic and lumbar spine good range of motion.  Shoulder joints elbow joints wrist joints with good range of motion.  He has PIP and DIP thickening in his hands consistent with osteoarthritis.  Hip joints were in good range of motion.  He is crepitus in his knee joints without any warmth swelling or effusion.  He has no tenderness over MTP joints.  No tophi were noted on examination today.  CDAI Exam: CDAI Score: Not documented Patient Global Assessment: Not documented; Provider Global Assessment: Not documented Swollen: Not documented; Tender: Not documented Joint Exam   Not documented   There is currently no information documented on the homunculus. Go to the Rheumatology activity and complete the homunculus joint exam.  Investigation: No additional findings.  Imaging: Xr Knee 3 View Left  Result Date: 12/16/2018 Moderate to severe medial compartment narrowing was noted.  Mild patellofemoral narrowing was noted.  No chondrocalcinosis was noted. Impression: These findings are consistent with moderate to severe osteoarthritis and mild chondromalacia patella.  Xr Knee 3 View Right  Result Date: 12/16/2018 Moderate medial compartment narrowing was noted.  Mild patellofemoral narrowing was  noted.  No chondrocalcinosis was noted. Impression: These findings are consistent with moderate osteoarthritis and mild chondromalacia patella.   Recent Labs: Lab Results  Component Value Date   WBC 6.0 09/15/2018   HGB 15.3 09/15/2018   PLT 224 09/15/2018   NA 138 09/15/2018   K 3.9 09/15/2018   CL 101 09/15/2018   CO2 27 09/15/2018   GLUCOSE 93 09/15/2018   BUN 24 09/15/2018   CREATININE 1.15 09/15/2018   BILITOT 1.0 09/15/2018   ALKPHOS 39 (L) 10/10/2016   AST 21 09/15/2018   ALT 22 09/15/2018   PROT 7.4 09/15/2018   ALBUMIN 4.1 10/10/2016   CALCIUM 9.6 09/15/2018   GFRAA 84 09/15/2018    Speciality Comments: No specialty comments available.  Procedures:  Large Joint Inj: bilateral knee on  12/16/2018 3:38 PM Indications: pain Details: 27 G 1.5 in needle, medial approach  Arthrogram: No  Medications (Right): 1.5 mL lidocaine 1 %; 40 mg triamcinolone acetonide 40 MG/ML Medications (Left): 1.5 mL lidocaine 1 %; 40 mg triamcinolone acetonide 40 MG/ML Outcome: tolerated well, no immediate complications Procedure, treatment alternatives, risks and benefits explained, specific risks discussed. Consent was given by the patient. Immediately prior to procedure a time out was called to verify the correct patient, procedure, equipment, support staff and site/side marked as required. Patient was prepped and draped in the usual sterile fashion.     Allergies: Patient has no known allergies.   Assessment / Plan:     Visit Diagnoses: Idiopathic chronic gout of multiple sites without tophus -patient ran out of allopurinol recently but restarted the medication.  He denies any recent gout flares.  He is on allopurinol 300 mg p.o. daily which he is taking only half a tablet this week and will increase to 1 tablet next week., colchicine 0.6 mg p.o. daily as needed.  Uric acid: 09/15/2018 8.9.  Prescription refill for colchicine was given today.  Primary osteoarthritis of both hands-joint  protection muscle strengthening was discussed.  Chronic pain of both knees -he has been experiencing increased pain in his bilateral knee joints.  No warmth swelling or effusion was noted.  Plan: XR KNEE 3 VIEW RIGHT, XR KNEE 3 VIEW LEFT x-ray showed moderate osteoarthritis in the right knee joint and moderate to severe osteoarthritis in the left knee joint.  Bilateral mild chondromalacia patella was noted.  Per patient's request bilateral knee joints were injected with cortisone as described above.  He has had cortisone injection in the past which did not last for very long time.  We will also apply for Visco supplement injections for bilateral knee joints.  Primary osteoarthritis of both knees   Medication monitoring-I have advised patient to return in August for CBC CMP and uric acid.  Orders: Orders Placed This Encounter  Procedures   Large Joint Inj   XR KNEE 3 VIEW RIGHT   XR KNEE 3 VIEW LEFT   CBC with Differential/Platelet   COMPLETE METABOLIC PANEL WITH GFR   Uric acid   Meds ordered this encounter  Medications   Colchicine (MITIGARE) 0.6 MG CAPS    Sig: Take 0.6 mg by mouth daily as needed.    Dispense:  30 capsule    Refill:  5    Follow-Up Instructions: Return in about 6 months (around 06/17/2019) for Gout, Osteoarthritis.   Pollyann Savoy, MD  Note - This record has been created using Animal nutritionist.  Chart creation errors have been sought, but may not always  have been located. Such creation errors do not reflect on  the standard of medical care.

## 2018-12-08 ENCOUNTER — Telehealth: Payer: Self-pay | Admitting: *Deleted

## 2018-12-08 NOTE — Telephone Encounter (Signed)
Patient requesting RF on allopurinoil - [facility]. Patient's contact [phone]

## 2018-12-08 NOTE — Telephone Encounter (Signed)
Spoke with patient and advised we sent prescription for a 90 day supply of allopurinol to the pharmacy on 11/02/18. Patient states he has not picked that prescription up yet. Patient will contact the pharmacy and pick it up today.

## 2018-12-16 ENCOUNTER — Ambulatory Visit (INDEPENDENT_AMBULATORY_CARE_PROVIDER_SITE_OTHER): Payer: BC Managed Care – PPO

## 2018-12-16 ENCOUNTER — Other Ambulatory Visit: Payer: Self-pay

## 2018-12-16 ENCOUNTER — Ambulatory Visit: Payer: Self-pay

## 2018-12-16 ENCOUNTER — Telehealth: Payer: Self-pay | Admitting: *Deleted

## 2018-12-16 ENCOUNTER — Encounter: Payer: Self-pay | Admitting: Rheumatology

## 2018-12-16 ENCOUNTER — Ambulatory Visit: Payer: BC Managed Care – PPO | Admitting: Rheumatology

## 2018-12-16 VITALS — BP 107/63 | HR 46 | Resp 14 | Ht 74.0 in | Wt 254.6 lb

## 2018-12-16 DIAGNOSIS — M1A09X0 Idiopathic chronic gout, multiple sites, without tophus (tophi): Secondary | ICD-10-CM

## 2018-12-16 DIAGNOSIS — M25562 Pain in left knee: Secondary | ICD-10-CM

## 2018-12-16 DIAGNOSIS — G8929 Other chronic pain: Secondary | ICD-10-CM

## 2018-12-16 DIAGNOSIS — M170 Bilateral primary osteoarthritis of knee: Secondary | ICD-10-CM | POA: Diagnosis not present

## 2018-12-16 DIAGNOSIS — M19041 Primary osteoarthritis, right hand: Secondary | ICD-10-CM | POA: Diagnosis not present

## 2018-12-16 DIAGNOSIS — M25561 Pain in right knee: Secondary | ICD-10-CM

## 2018-12-16 DIAGNOSIS — Z5181 Encounter for therapeutic drug level monitoring: Secondary | ICD-10-CM

## 2018-12-16 DIAGNOSIS — M19042 Primary osteoarthritis, left hand: Secondary | ICD-10-CM

## 2018-12-16 MED ORDER — COLCHICINE 0.6 MG PO CAPS: 0.6000 mg | ORAL_CAPSULE | Freq: Every day | ORAL | 5 refills | Status: DC | PRN

## 2018-12-16 MED ORDER — TRIAMCINOLONE ACETONIDE 40 MG/ML IJ SUSP
40.0000 mg | INTRAMUSCULAR | Status: AC | PRN
  Administered 2018-12-16: 40 mg via INTRA_ARTICULAR

## 2018-12-16 MED ORDER — LIDOCAINE HCL 1 % IJ SOLN
1.5000 mL | INTRAMUSCULAR | Status: AC | PRN
  Administered 2018-12-16: 1.5 mL

## 2018-12-16 NOTE — Telephone Encounter (Signed)
Please apply for visco, bilateral knees for Dylan Obrien. Thank you.

## 2018-12-16 NOTE — Patient Instructions (Addendum)
Return in August for CBC, CMP and uric acid   Knee Exercises Ask your health care provider which exercises are safe for you. Do exercises exactly as told by your health care provider and adjust them as directed. It is normal to feel mild stretching, pulling, tightness, or discomfort as you do these exercises, but you should stop right away if you feel sudden pain or your pain gets worse.Do not begin these exercises until told by your health care provider. STRETCHING AND RANGE OF MOTION EXERCISES These exercises warm up your muscles and joints and improve the movement and flexibility of your knee. These exercises also help to relieve pain, numbness, and tingling. Exercise A: Knee Extension, Prone 1. Lie on your abdomen on a bed. 2. Place your left / right knee just beyond the edge of the surface so your knee is not on the bed. You can put a towel under your left / right thigh just above your knee for comfort. 3. Relax your leg muscles and allow gravity to straighten your knee. You should feel a stretch behind your left / right knee. 4. Hold this position for __________ seconds. 5. Scoot up so your knee is supported between repetitions. Repeat __________ times. Complete this stretch __________ times a day. Exercise B: Knee Flexion, Active  1. Lie on your back with both knees straight. If this causes back discomfort, bend your left / right knee so your foot is flat on the floor. 2. Slowly slide your left / right heel back toward your buttocks until you feel a gentle stretch in the front of your knee or thigh. 3. Hold this position for __________ seconds. 4. Slowly slide your left / right heel back to the starting position. Repeat __________ times. Complete this exercise __________ times a day. Exercise C: Quadriceps, Prone  1. Lie on your abdomen on a firm surface, such as a bed or padded floor. 2. Bend your left / right knee and hold your ankle. If you cannot reach your ankle or pant leg, loop  a belt around your foot and grab the belt instead. 3. Gently pull your heel toward your buttocks. Your knee should not slide out to the side. You should feel a stretch in the front of your thigh and knee. 4. Hold this position for __________ seconds. Repeat __________ times. Complete this stretch __________ times a day. Exercise D: Hamstring, Supine 1. Lie on your back. 2. Loop a belt or towel over the ball of your left / right foot. The ball of your foot is on the walking surface, right under your toes. 3. Straighten your left / right knee and slowly pull on the belt to raise your leg until you feel a gentle stretch behind your knee. ? Do not let your left / right knee bend while you do this. ? Keep your other leg flat on the floor. 4. Hold this position for __________ seconds. Repeat __________ times. Complete this stretch __________ times a day. STRENGTHENING EXERCISES These exercises build strength and endurance in your knee. Endurance is the ability to use your muscles for a long time, even after they get tired. Exercise E: Quadriceps, Isometric  1. Lie on your back with your left / right leg extended and your other knee bent. Put a rolled towel or small pillow under your knee if told by your health care provider. 2. Slowly tense the muscles in the front of your left / right thigh. You should see your kneecap slide up toward  your hip or see increased dimpling just above the knee. This motion will push the back of the knee toward the floor. 3. For __________ seconds, keep the muscle as tight as you can without increasing your pain. 4. Relax the muscles slowly and completely. Repeat __________ times. Complete this exercise __________ times a day. Exercise F: Straight Leg Raises - Quadriceps 1. Lie on your back with your left / right leg extended and your other knee bent. 2. Tense the muscles in the front of your left / right thigh. You should see your kneecap slide up or see increased  dimpling just above the knee. Your thigh may even shake a bit. 3. Keep these muscles tight as you raise your leg 4-6 inches (10-15 cm) off the floor. Do not let your knee bend. 4. Hold this position for __________ seconds. 5. Keep these muscles tense as you lower your leg. 6. Relax your muscles slowly and completely after each repetition. Repeat __________ times. Complete this exercise __________ times a day. Exercise G: Hamstring, Isometric 1. Lie on your back on a firm surface. 2. Bend your left / right knee approximately __________ degrees. 3. Dig your left / right heel into the surface as if you are trying to pull it toward your buttocks. Tighten the muscles in the back of your thighs to dig as hard as you can without increasing any pain. 4. Hold this position for __________ seconds. 5. Release the tension gradually and allow your muscles to relax completely for __________ seconds after each repetition. Repeat __________ times. Complete this exercise __________ times a day. Exercise H: Hamstring Curls  If told by your health care provider, do this exercise while wearing ankle weights. Begin with __________ weights. Then increase the weight by 1 lb (0.5 kg) increments. Do not wear ankle weights that are more than __________. 1. Lie on your abdomen with your legs straight. 2. Bend your left / right knee as far as you can without feeling pain. Keep your hips flat against the floor. 3. Hold this position for __________ seconds. 4. Slowly lower your leg to the starting position.  Repeat __________ times. Complete this exercise __________ times a day. Exercise I: Squats (Quadriceps) 1. Stand in front of a table, with your feet and knees pointing straight ahead. You may rest your hands on the table for balance but not for support. 2. Slowly bend your knees and lower your hips like you are going to sit in a chair. ? Keep your weight over your heels, not over your toes. ? Keep your lower legs  upright so they are parallel with the table legs. ? Do not let your hips go lower than your knees. ? Do not bend lower than told by your health care provider. ? If your knee pain increases, do not bend as low. 3. Hold the squat position for __________ seconds. 4. Slowly push with your legs to return to standing. Do not use your hands to pull yourself to standing. Repeat __________ times. Complete this exercise __________ times a day. Exercise J: Wall Slides (Quadriceps)  1. Lean your back against a smooth wall or door while you walk your feet out 18-24 inches (46-61 cm) from it. 2. Place your feet hip-width apart. 3. Slowly slide down the wall or door until your knees bend __________ degrees. Keep your knees over your heels, not over your toes. Keep your knees in line with your hips. 4. Hold for __________ seconds. Repeat __________ times. Complete this  exercise __________ times a day. Exercise K: Straight Leg Raises - Hip Abductors 1. Lie on your side with your left / right leg in the top position. Lie so your head, shoulder, knee, and hip line up. You may bend your bottom knee to help you keep your balance. 2. Roll your hips slightly forward so your hips are stacked directly over each other and your left / right knee is facing forward. 3. Leading with your heel, lift your top leg 4-6 inches (10-15 cm). You should feel the muscles in your outer hip lifting. ? Do not let your foot drift forward. ? Do not let your knee roll toward the ceiling. 4. Hold this position for __________ seconds. 5. Slowly return your leg to the starting position. 6. Let your muscles relax completely after each repetition. Repeat __________ times. Complete this exercise __________ times a day. Exercise L: Straight Leg Raises - Hip Extensors 1. Lie on your abdomen on a firm surface. You can put a pillow under your hips if that is more comfortable. 2. Tense the muscles in your buttocks and lift your left / right leg  about 4-6 inches (10-15 cm). Keep your knee straight as you lift your leg. 3. Hold this position for __________ seconds. 4. Slowly lower your leg to the starting position. 5. Let your leg relax completely after each repetition. Repeat __________ times. Complete this exercise __________ times a day. This information is not intended to replace advice given to you by your health care provider. Make sure you discuss any questions you have with your health care provider. Document Released: 05/08/2005 Document Revised: 03/18/2016 Document Reviewed: 04/30/2015 Elsevier Interactive Patient Education  2018 [employer]

## 2018-12-18 ENCOUNTER — Telehealth: Payer: Self-pay

## 2018-12-18 NOTE — Telephone Encounter (Signed)
Noted  

## 2018-12-18 NOTE — Telephone Encounter (Signed)
PA required for Synvisc series, bilateral knee. Faxed completed PA form to [employer] at [phone]. 

## 2018-12-18 NOTE — Telephone Encounter (Signed)
Submitted VOB for Synvisc series, bilateral knee. 

## 2018-12-24 ENCOUNTER — Telehealth: Payer: Self-pay

## 2018-12-24 NOTE — Telephone Encounter (Signed)
Refaxed completed PA form to [facility] at [phone].

## 2018-12-29 ENCOUNTER — Encounter: Payer: Self-pay | Admitting: Rheumatology

## 2018-12-29 NOTE — Progress Notes (Signed)
Synvisc Series bilateral knees approved per [facility] # [id number], valid 12/18/18 through 12/18/19.  Buy and bill ok.  Ins covers at 100%.    Please call patient and schedule appts for these injections.  Thanks.

## 2018-12-31 ENCOUNTER — Telehealth: Payer: Self-pay

## 2018-12-31 ENCOUNTER — Other Ambulatory Visit: Payer: Self-pay

## 2018-12-31 ENCOUNTER — Telehealth: Payer: Self-pay | Admitting: Rheumatology

## 2018-12-31 ENCOUNTER — Other Ambulatory Visit: Payer: Self-pay | Admitting: *Deleted

## 2018-12-31 ENCOUNTER — Ambulatory Visit (INDEPENDENT_AMBULATORY_CARE_PROVIDER_SITE_OTHER): Payer: BC Managed Care – PPO | Admitting: Physician Assistant

## 2018-12-31 DIAGNOSIS — M1712 Unilateral primary osteoarthritis, left knee: Secondary | ICD-10-CM

## 2018-12-31 MED ORDER — LIDOCAINE HCL 1 % IJ SOLN
1.5000 mL | INTRAMUSCULAR | Status: AC | PRN
  Administered 2018-12-31: 1.5 mL

## 2018-12-31 MED ORDER — ALLOPURINOL 300 MG PO TABS: ORAL_TABLET | ORAL | 0 refills | Status: DC

## 2018-12-31 MED ORDER — HYLAN G-F 20 48 MG/6ML IX SOSY
48.0000 mg | PREFILLED_SYRINGE | INTRA_ARTICULAR | Status: AC | PRN
  Administered 2018-12-31: 48 mg via INTRA_ARTICULAR

## 2018-12-31 NOTE — Telephone Encounter (Signed)
See telephone encounter from 12/31/2018.

## 2018-12-31 NOTE — Telephone Encounter (Signed)
Spoke with patient's wife who stated Devine lost his cell phone, but will contact him to call our office to schedule the Synvisc injections.

## 2018-12-31 NOTE — Telephone Encounter (Signed)
Spoke with patient's wife and she will have patient contact the office to schedule the synvisc one injections.

## 2018-12-31 NOTE — Telephone Encounter (Signed)
Patient was approved for synvisc bilateral buy and bill but patient will not have insurance after Tuesday 01/05/2019. Can we apply for synvisc one? Or do a buy and bill for synvisc one?

## 2018-12-31 NOTE — Progress Notes (Addendum)
   Procedure Note  Patient: Dylan Obrien             Date of Birth: 1966-02-05           MRN: [id number]             Visit Date: 12/31/2018  Procedures: Visit Diagnoses:  1. Primary osteoarthritis of left knee    Synvisc one Left knee joint injection Large Joint Inj: L knee on 12/31/2018 2:16 PM Indications: pain Details: 25 G 1.5 in needle, medial approach  Arthrogram: No  Medications: 48 mg Hylan 48 MG/6ML; 1.5 mL lidocaine 1 % Aspirate: 0 mL Outcome: tolerated well, no immediate complications Procedure, treatment alternatives, risks and benefits explained, specific risks discussed. Consent was given by the patient. Immediately prior to procedure a time out was called to verify the correct patient, procedure, equipment, support staff and site/side marked as required. Patient was prepped and draped in the usual sterile fashion.    xylocaine:  [id number] Lot #[id number] Exp: 04/06/22  SynviscOne: Lot: [id number] Ex. 7/1//22  Patient tolerated the procedure well. Aftercare was discussed.  Hazel Sams, PA-C

## 2018-12-31 NOTE — Telephone Encounter (Signed)
He is covered for 96 units of Synvisc/Synvisc One, so you can switch to Synvisc One, yes and buy and bill is ok.   The 96 units that we bill out/that is approved translates to bilateral of either one, so it will be fine to do switch.  Let me know if you still have questions.

## 2018-12-31 NOTE — Telephone Encounter (Signed)
Patient contact the office requesting a refill on Allopurinol.  Last Visit: 12/16/18 Next Visit: 06/17/19 Labs: 09/15/18 cbc/cmp wnl uric acid 8.9  Okay to refill per Dr. Estanislado Pandy

## 2019-01-01 ENCOUNTER — Ambulatory Visit (INDEPENDENT_AMBULATORY_CARE_PROVIDER_SITE_OTHER): Payer: BC Managed Care – PPO | Admitting: Physician Assistant

## 2019-01-01 DIAGNOSIS — M1711 Unilateral primary osteoarthritis, right knee: Secondary | ICD-10-CM

## 2019-01-01 MED ORDER — LIDOCAINE HCL 1 % IJ SOLN
1.5000 mL | INTRAMUSCULAR | Status: AC | PRN
  Administered 2019-01-01: 1.5 mL

## 2019-01-01 MED ORDER — HYLAN G-F 20 48 MG/6ML IX SOSY
48.0000 mg | PREFILLED_SYRINGE | INTRA_ARTICULAR | Status: AC | PRN
  Administered 2019-01-01: 48 mg via INTRA_ARTICULAR

## 2019-01-01 NOTE — Progress Notes (Signed)
   Procedure Note  Patient: Dylan Obrien             Date of Birth: 1965-07-25           MRN: [id number]             Visit Date: 01/01/2019  Procedures: Visit Diagnoses:  1. Primary osteoarthritis of right knee    Synvisc one right knee B/B Large Joint Inj: R knee on 01/01/2019 9:46 AM Indications: pain Details: 22 G 1.5 in needle, medial approach  Arthrogram: No  Medications: 48 mg Hylan 48 MG/6ML; 1.5 mL lidocaine 1 % Aspirate: 0 mL Outcome: tolerated well, no immediate complications Procedure, treatment alternatives, risks and benefits explained, specific risks discussed. Consent was given by the patient. Immediately prior to procedure a time out was called to verify the correct patient, procedure, equipment, support staff and site/side marked as required. Patient was prepped and draped in the usual sterile fashion.     Patient tolerated the procedure well.  Hazel Sams, PA-C

## 2019-03-20 ENCOUNTER — Other Ambulatory Visit: Payer: Self-pay | Admitting: Rheumatology

## 2019-03-22 NOTE — Telephone Encounter (Signed)
Last Visit: 12/16/18 Next Visit: 06/17/19 Labs: 09/26/18   Attempted to contact the patient and left message for patient to call the office.

## 2019-04-01 ENCOUNTER — Other Ambulatory Visit: Payer: Self-pay

## 2019-04-01 DIAGNOSIS — Z5181 Encounter for therapeutic drug level monitoring: Secondary | ICD-10-CM

## 2019-04-01 DIAGNOSIS — M1A09X0 Idiopathic chronic gout, multiple sites, without tophus (tophi): Secondary | ICD-10-CM

## 2019-04-02 LAB — CBC WITH DIFFERENTIAL/PLATELET
Absolute Monocytes: 490 cells/uL (ref 200–950)
Basophils Absolute: 40 cells/uL (ref 0–200)
Basophils Relative: 0.7 %
Eosinophils Absolute: 182 cells/uL (ref 15–500)
Eosinophils Relative: 3.2 %
HCT: 44.6 % (ref 38.5–50.0)
Hemoglobin: 15.1 g/dL (ref 13.2–17.1)
Lymphs Abs: 1220 cells/uL (ref 850–3900)
MCH: 31.3 pg (ref 27.0–33.0)
MCHC: 33.9 g/dL (ref 32.0–36.0)
MCV: 92.3 fL (ref 80.0–100.0)
MPV: 11 fL (ref 7.5–12.5)
Monocytes Relative: 8.6 %
Neutro Abs: 3768 cells/uL (ref 1500–7800)
Neutrophils Relative %: 66.1 %
Platelets: 223 10*3/uL (ref 140–400)
RBC: 4.83 10*6/uL (ref 4.20–5.80)
RDW: 13 % (ref 11.0–15.0)
Total Lymphocyte: 21.4 %
WBC: 5.7 10*3/uL (ref 3.8–10.8)

## 2019-04-02 LAB — COMPLETE METABOLIC PANEL WITH GFR
AG Ratio: 1.7 (calc) (ref 1.0–2.5)
ALT: 23 U/L (ref 9–46)
AST: 26 U/L (ref 10–35)
Albumin: 4.4 g/dL (ref 3.6–5.1)
Alkaline phosphatase (APISO): 49 U/L (ref 35–144)
BUN/Creatinine Ratio: 27 (calc) — ABNORMAL HIGH (ref 6–22)
BUN: 26 mg/dL — ABNORMAL HIGH (ref 7–25)
CO2: 27 mmol/L (ref 20–32)
Calcium: 9.3 mg/dL (ref 8.6–10.3)
Chloride: 103 mmol/L (ref 98–110)
Creat: 0.98 mg/dL (ref 0.70–1.33)
GFR, Est African American: 101 mL/min/{1.73_m2} (ref >=60)
GFR, Est Non African American: 87 mL/min/{1.73_m2} (ref >=60)
Globulin: 2.6 g/dL (calc) (ref 1.9–3.7)
Glucose, Bld: 101 mg/dL — ABNORMAL HIGH (ref 65–99)
Potassium: 4.4 mmol/L (ref 3.5–5.3)
Sodium: 138 mmol/L (ref 135–146)
Total Bilirubin: 0.7 mg/dL (ref 0.2–1.2)
Total Protein: 7 g/dL (ref 6.1–8.1)

## 2019-04-02 LAB — URIC ACID: Uric Acid, Serum: 4.6 mg/dL (ref 4.0–8.0)

## 2019-04-06 ENCOUNTER — Encounter: Payer: Self-pay | Admitting: *Deleted

## 2019-05-27 ENCOUNTER — Other Ambulatory Visit: Payer: Self-pay | Admitting: Rheumatology

## 2019-05-27 NOTE — Telephone Encounter (Signed)
Last Visit: 12/16/18 Next Visit: 06/17/19 Labs: 04/01/19 Uric acid is WNL-46. BUN borderline elevated. Rest of CMP WNL. CBC WNL.  Okay to refill per Dr. Estanislado Pandy

## 2019-06-02 ENCOUNTER — Telehealth: Payer: Self-pay

## 2019-06-02 NOTE — Telephone Encounter (Signed)
Received fax from [facility] [place] that colchicine capsules were denied.   Attempted to contact patient and left message on machine to advise patient to contact the office.

## 2019-06-17 ENCOUNTER — Ambulatory Visit: Payer: BC Managed Care – PPO | Admitting: Rheumatology

## 2019-08-09 ENCOUNTER — Encounter (HOSPITAL_COMMUNITY): Payer: Self-pay | Admitting: *Deleted

## 2019-08-09 ENCOUNTER — Emergency Department (HOSPITAL_COMMUNITY)
Admission: EM | Admit: 2019-08-09 | Discharge: 2019-08-09 | Disposition: A | Discharge status: Home / Self Care | Payer: BC Managed Care – PPO | Attending: Emergency Medicine | Admitting: Emergency Medicine

## 2019-08-09 ENCOUNTER — Other Ambulatory Visit: Payer: Self-pay

## 2019-08-09 ENCOUNTER — Emergency Department (HOSPITAL_COMMUNITY): Payer: BC Managed Care – PPO

## 2019-08-09 DIAGNOSIS — U071 COVID-19: Secondary | ICD-10-CM | POA: Diagnosis not present

## 2019-08-09 DIAGNOSIS — Z79899 Other long term (current) drug therapy: Secondary | ICD-10-CM | POA: Diagnosis not present

## 2019-08-09 DIAGNOSIS — J1282 Pneumonia due to coronavirus disease 2019: Secondary | ICD-10-CM | POA: Diagnosis not present

## 2019-08-09 DIAGNOSIS — R61 Generalized hyperhidrosis: Secondary | ICD-10-CM | POA: Insufficient documentation

## 2019-08-09 DIAGNOSIS — R0602 Shortness of breath: Secondary | ICD-10-CM | POA: Insufficient documentation

## 2019-08-09 DIAGNOSIS — R410 Disorientation, unspecified: Secondary | ICD-10-CM | POA: Diagnosis not present

## 2019-08-09 DIAGNOSIS — M7918 Myalgia, other site: Secondary | ICD-10-CM | POA: Diagnosis present

## 2019-08-09 LAB — COMPREHENSIVE METABOLIC PANEL
ALT: 44 U/L (ref 0–44)
AST: 54 U/L — ABNORMAL HIGH (ref 15–41)
Albumin: 3.3 g/dL — ABNORMAL LOW (ref 3.5–5.0)
Alkaline Phosphatase: 40 U/L (ref 38–126)
Anion gap: 11 (ref 5–15)
BUN: 16 mg/dL (ref 6–20)
CO2: 24 mmol/L (ref 22–32)
Calcium: 8.2 mg/dL — ABNORMAL LOW (ref 8.9–10.3)
Chloride: 101 mmol/L (ref 98–111)
Creatinine, Ser: 1.13 mg/dL (ref 0.61–1.24)
GFR calc Af Amer: >60 mL/min (ref >60)
GFR calc non Af Amer: >60 mL/min (ref >60)
Glucose, Bld: 124 mg/dL — ABNORMAL HIGH (ref 70–99)
Potassium: 4 mmol/L (ref 3.5–5.1)
Sodium: 136 mmol/L (ref 135–145)
Total Bilirubin: 1.2 mg/dL (ref 0.3–1.2)
Total Protein: 6.7 g/dL (ref 6.5–8.1)

## 2019-08-09 LAB — CBC WITH DIFFERENTIAL/PLATELET
Abs Immature Granulocytes: 0.11 10*3/uL — ABNORMAL HIGH (ref 0.00–0.07)
Basophils Absolute: 0 10*3/uL (ref 0.0–0.1)
Basophils Relative: 0 %
Eosinophils Absolute: 0 10*3/uL (ref 0.0–0.5)
Eosinophils Relative: 0 %
HCT: 33.7 % — ABNORMAL LOW (ref 39.0–52.0)
Hemoglobin: 11.6 g/dL — ABNORMAL LOW (ref 13.0–17.0)
Immature Granulocytes: 2 %
Lymphocytes Relative: 12 %
Lymphs Abs: 0.8 10*3/uL (ref 0.7–4.0)
MCH: 31.7 pg (ref 26.0–34.0)
MCHC: 34.4 g/dL (ref 30.0–36.0)
MCV: 92.1 fL (ref 80.0–100.0)
Monocytes Absolute: 0.4 10*3/uL (ref 0.1–1.0)
Monocytes Relative: 6 %
Neutro Abs: 5.4 10*3/uL (ref 1.7–7.7)
Neutrophils Relative %: 80 %
Platelets: 303 10*3/uL (ref 150–400)
RBC: 3.66 MIL/uL — ABNORMAL LOW (ref 4.22–5.81)
RDW: 13.5 % (ref 11.5–15.5)
WBC: 6.7 10*3/uL (ref 4.0–10.5)
nRBC: 0.3 % — ABNORMAL HIGH (ref 0.0–0.2)

## 2019-08-09 NOTE — Discharge Instructions (Signed)
Sleep at an incline or on your stomach.  Make sure you are staying hydrated at home and urinating every 4-6 hours.  You can take tylenol and ibuprofen (2 tabs each) every 6 hours for pain and fever.

## 2019-08-09 NOTE — ED Provider Notes (Signed)
[facility]-EMERGENCY DEPT Provider Note   CSN: [id number] Arrival date & time: 08/09/19  1133     History Chief Complaint  Patient presents with  . Shortness of Breath    Covid + 07/30/19    Dylan Obrien is a 54 y.o. male.  Patient is a 54 year old male with no significant medical history presenting today with worsening generalized body aches, diaphoresis, sweating, shortness of breath.  Patient tested positive for Covid and has had symptoms for approximately 11 days.  He states 3 days ago he was feeling a little bit better but then in the last few days it has just gotten worse.  Last night he was diaphoretic and febrile.  He states at times he was a little bit confused.  Today he is noticed some more shortness of breath but states he really feels like he still breathing okay.  He notices it most if he goes upstairs or walks for a long distance.  He has had no vomiting or diarrhea but has had decreased taste and decreased food intake for the last several days but is still been drinking fluids.  He has been checking his pulse ox at home and has been okay.  The history is provided by the patient.  Shortness of Breath Severity:  Mild Onset quality:  Gradual Duration:  11 days Timing:  Constant Progression:  Worsening Chronicity:  New Context: URI   Relieved by:  Rest Worsened by:  Activity and coughing Ineffective treatments:  NSAIDs Associated symptoms: chest pain, cough, fever and headaches   Associated symptoms: no abdominal pain, no sputum production and no wheezing   Risk factors comment:  Only medical history is of gout      Past Medical History:  Diagnosis Date  . Allergy   . Arthritis   . Chest pain 04/23/2012  . Gout   . Hx of cardiovascular stress test    a. ETT 11/13 with borderline ST changes => b. ETT-Myoview:  63mm ST depression V3-6 on ECG (false +), images with no ischemia, EF 58%    Patient Active Problem List   Diagnosis Date Noted   . Primary osteoarthritis of both knees 10/14/2016  . Primary osteoarthritis of both hands 10/14/2016  . Gout   . Hx of cardiovascular stress test   . Chest pain 04/23/2012    Past Surgical History:  Procedure Laterality Date  . EXTERNAL FIXATION ARM  1989       Family History  Problem Relation Age of Onset  . Cancer Sister        brain  . Colon cancer Neg Hx   . Esophageal cancer Neg Hx   . Rectal cancer Neg Hx   . Stomach cancer Neg Hx     Social History   Tobacco Use  . Smoking status: Never Smoker  . Smokeless tobacco: Never Used  Substance Use Topics  . Alcohol use: Not Currently    Alcohol/week: 0.0 standard drinks  . Drug use: No    Home Medications Prior to Admission medications   Medication Sig Start Date End Date Taking? Authorizing Provider  allopurinol (ZYLOPRIM) 300 MG tablet TAKE 1 TABLET BY MOUTH EVERY DAY 05/27/19   Pollyann Savoy, MD  azelastine (ASTELIN) 0.1 % nasal spray Place 1 spray into both nostrils as needed.  10/01/16   [provider]  Colchicine (MITIGARE) 0.6 MG CAPS Take 0.6 mg by mouth daily as needed. 12/16/18   Pollyann Savoy, MD  Cyanocobalamin (VITAMIN B-12 PO)  Take by mouth.    [provider]  Omega-3 Fatty Acids (FISH OIL PO) Take by mouth.    [provider]    Allergies    Patient has no known allergies.  Review of Systems   Review of Systems  Constitutional: Positive for fever.  Respiratory: Positive for cough and shortness of breath. Negative for sputum production and wheezing.   Cardiovascular: Positive for chest pain.  Gastrointestinal: Negative for abdominal pain.  Neurological: Positive for headaches.  All other systems reviewed and are negative.   Physical Exam Updated Vital Signs BP 113/86 (BP Location: Right Arm)   Pulse 61   Temp 99.6 F (37.6 C) (Oral)   Resp (!) 22   Ht 6\' 2"  (1.88 m)   Wt 115.2 kg   SpO2 94%   BMI 32.61 kg/m   Physical Exam Vitals and nursing  note reviewed.  Constitutional:      General: He is not in acute distress.    Appearance: He is well-developed and normal weight.  HENT:     Head: Normocephalic and atraumatic.  Eyes:     Conjunctiva/sclera: Conjunctivae normal.     Pupils: Pupils are equal, round, and reactive to light.  Cardiovascular:     Rate and Rhythm: Normal rate and regular rhythm.     Heart sounds: No murmur.  Pulmonary:     Effort: Pulmonary effort is normal. Tachypnea present. No respiratory distress.     Breath sounds: Normal breath sounds. No wheezing or rales.  Abdominal:     General: There is no distension.     Palpations: Abdomen is soft.     Tenderness: There is no abdominal tenderness. There is no guarding or rebound.  Musculoskeletal:        General: No tenderness. Normal range of motion.     Cervical back: Normal range of motion and neck supple.     Right lower leg: No edema.     Left lower leg: No edema.  Skin:    General: Skin is warm and dry.     Findings: No erythema or rash.  Neurological:     Mental Status: He is alert and oriented to person, place, and time.  Psychiatric:        Behavior: Behavior normal.     ED Results / Procedures / Treatments   Labs (all labs ordered are listed, but only abnormal results are displayed) Labs Reviewed  CBC WITH DIFFERENTIAL/PLATELET - Abnormal; Notable for the following components:      Result Value   RBC 3.66 (*)    Hemoglobin 11.6 (*)    HCT 33.7 (*)    nRBC 0.3 (*)    All other components within normal limits  COMPREHENSIVE METABOLIC PANEL - Abnormal; Notable for the following components:   Glucose, Bld 124 (*)    Calcium 8.2 (*)    Albumin 3.3 (*)    AST 54 (*)    All other components within normal limits    EKG None  Radiology DG Chest Port 1 View  Result Date: 08/09/2019 CLINICAL DATA:  Cough and fever. EXAM: PORTABLE CHEST 1 VIEW COMPARISON:  None. FINDINGS: The cardiac silhouette, mediastinal and hilar contours are within  normal limits given the AP projection and portable technique. Vague ill-defined patchy interstitial and airspace infiltrates in both lungs suspicious for atypical pneumonia such as COVID pneumonia. No pleural effusion or pulmonary lesions. The bony thorax is intact. IMPRESSION: Patchy bilateral ill-defined infiltrates suspicious for COVID pneumonia.  Electronically Signed   By: Marijo Sanes M.D.   On: 08/09/2019 12:31    Procedures Procedures (including critical care time)  Medications Ordered in ED Medications - No data to display  ED Course  I have reviewed the triage vital signs and the nursing notes.  Pertinent labs & imaging results that were available during my care of the patient were reviewed by me and considered in my medical decision making (see chart for details).    MDM Rules/Calculators/A&P                      54 year old male presenting today with persistent Covid symptoms and fever overnight with mild shortness of breath this morning.  Patient's oxygen saturation remains between 93 and 95% on room air.  He is awake alert and oriented.  Mild tachypnea with respirations of 22 but overall well-appearing.  Doubt severe dehydration at this time.  Chest x-ray is consistent with patchy bilateral ill-defined infiltrates consistent with Covid pneumonia.  CBC without significant findings and CMP without significant findings.  Will reassure pt and have him continue supportive care at home and checking O2 sat and return for worsening sx.  Final Clinical Impression(s) / ED Diagnoses Final diagnoses:  Pneumonia due to COVID-19 virus    Rx / DC Orders ED Discharge Orders    None       Blanchie Dessert, MD 08/09/19 1313

## 2019-08-09 NOTE — ED Triage Notes (Signed)
Pt states he tested (+) Covid 07/30/19, Fever around 100.5 relieved with Tylenol. Sats in low 90's at home, occ cough, pain in sides

## 2019-08-18 ENCOUNTER — Other Ambulatory Visit: Payer: Self-pay | Admitting: Rheumatology

## 2019-08-18 NOTE — Telephone Encounter (Signed)
LMOM for patient to call and schedule follow-up appointment.   °

## 2019-08-18 NOTE — Telephone Encounter (Signed)
Please schedule patient for a follow up visit. Patient was due December 2020. Thanks! 

## 2019-08-18 NOTE — Telephone Encounter (Signed)
Last Visit: 12/16/18 Next Visit: due December 2020. Message sent to the front to schedule. Labs: 08/09/19 Glucose 124, Calcium 8.2, Albumin 3.3 AST 54, RBC 3.66, Hgb 11.6, Hct 33.7  Okay to refill per Dr. Corliss Skains

## 2019-08-30 ENCOUNTER — Telehealth: Payer: Self-pay | Admitting: Rheumatology

## 2019-08-30 NOTE — Telephone Encounter (Signed)
Ok to apply for series of 3 visco gel injections for both knee joints.  Euflexxa or Orthovisc will be good options.

## 2019-08-30 NOTE — Telephone Encounter (Signed)
Patient called stating he would like to apply for visco injections for his bilateral knees.  Patient states he would like to apply for the series of 3 injections instead of the 1 injection he had last year.

## 2019-09-01 NOTE — Telephone Encounter (Signed)
Submitted for VOB on 08/31/2019.

## 2019-09-14 NOTE — Telephone Encounter (Signed)
I LMOM for patient to call, and schedule Visco knee injections. 

## 2019-09-14 NOTE — Telephone Encounter (Signed)
Please call to schedule Visco knee injections.  Authorized for Synvisc series Bilateral knees. Buy and Annette Stable Deductible does not apply. PA for Synvisc #[id number] (eff. 07/09/2019- 12/18/2019) Insurance will cover 100%, after $50.00 co-pay each visit.

## 2019-09-16 ENCOUNTER — Other Ambulatory Visit: Payer: Self-pay | Admitting: Rheumatology

## 2019-09-16 NOTE — Telephone Encounter (Signed)
Last Visit: 12/16/18 Next Visit: 10/11/19 Labs: 08/09/19 Glucose 124, Calcium 8.2, Albumin 3.3 AST 54, RBC 3.66, Hgb 11.6, Hct 33.7  Okay to refill per Dr. Corliss Skains

## 2019-10-04 NOTE — Progress Notes (Signed)
Office Visit Note  Patient: Dylan Obrien             Date of Birth: 1966-06-29           MRN: [id number]             PCP: Leanna Battles, MD Referring: Leanna Battles, MD Visit Date: 10/11/2019 Occupation: @GUAROCC @  Subjective:  Medication monitoring  History of Present Illness: Dylan Obrien is a 54 y.o. male with history of gout and osteoarthritis.  Patient is taking allopurinol 300 mg 1 tablet by mouth daily.  He denies any recent gout flares.  He states that he has stopped drinking beer which she feels has contributed to less frequent flares and his uric acid level being within normal limits.  He states that he has been walking several miles a day for exercise.  He states that his knees cause increased discomfort by the end of the walk.  He denies any warmth or joint swelling at this time.  He is here today for his first Synvisc injection for both knee joints.   Activities of Daily Living:  Patient reports morning stiffness for 15 minutes.   Patient Denies nocturnal pain.  Difficulty dressing/grooming: Denies Difficulty climbing stairs: Reports Difficulty getting out of chair: Denies Difficulty using hands for taps, buttons, cutlery, and/or writing: Denies  Review of Systems  Constitutional: Positive for fatigue. Negative for night sweats.  HENT: Negative for mouth sores, mouth dryness and nose dryness.   Eyes: Negative for redness, itching and dryness.  Respiratory: Negative for cough, shortness of breath and difficulty breathing.   Cardiovascular: Negative for chest pain, palpitations, hypertension, irregular heartbeat and swelling in legs/feet.  Gastrointestinal: Negative for blood in stool, constipation and diarrhea.  Endocrine: Negative for increased urination.  Genitourinary: Negative for difficulty urinating and painful urination.  Musculoskeletal: Positive for arthralgias, joint pain and morning stiffness. Negative for joint swelling, myalgias, muscle  weakness, muscle tenderness and myalgias.  Skin: Negative for color change, rash, hair loss, nodules/bumps, redness, skin tightness, ulcers and sensitivity to sunlight.  Allergic/Immunologic: Negative for susceptible to infections.  Neurological: Negative for dizziness, fainting, numbness, headaches, memory loss, night sweats and weakness.  Hematological: Negative for bruising/bleeding tendency and swollen glands.  Psychiatric/Behavioral: Negative for depressed mood, confusion and sleep disturbance. The patient is not nervous/anxious.     PMFS History:  Patient Active Problem List   Diagnosis Date Noted  . Primary osteoarthritis of both knees 10/14/2016  . Primary osteoarthritis of both hands 10/14/2016  . Gout   . Hx of cardiovascular stress test   . Chest pain 04/23/2012    Past Medical History:  Diagnosis Date  . Allergy   . Arthritis   . Chest pain 04/23/2012  . Gout   . Hx of cardiovascular stress test    a. ETT 11/13 with borderline ST changes => b. ETT-Myoview:  71mm ST depression V3-6 on ECG (false +), images with no ischemia, EF 58%    Family History  Problem Relation Age of Onset  . Cancer Sister        brain  . Colon cancer Neg Hx   . Esophageal cancer Neg Hx   . Rectal cancer Neg Hx   . Stomach cancer Neg Hx    Past Surgical History:  Procedure Laterality Date  . EXTERNAL FIXATION ARM  1989   Social History   Social History Narrative  . Not on file    There is no immunization history on file for this  patient.   Objective: Vital Signs: BP 116/76 (BP Location: Right Arm, Patient Position: Sitting, Cuff Size: Normal)   Pulse 64   Resp 15   Ht 6\' 2"  (1.88 m)   Wt 255 lb 12.8 oz (116 kg)   BMI 32.84 kg/m    Physical Exam Vitals and nursing note reviewed.  Constitutional:      Appearance: He is well-developed.  HENT:     Head: Normocephalic and atraumatic.  Eyes:     Conjunctiva/sclera: Conjunctivae normal.     Pupils: Pupils are equal, round, and  reactive to light.  Pulmonary:     Effort: Pulmonary effort is normal.  Abdominal:     General: Bowel sounds are normal.     Palpations: Abdomen is soft.  Musculoskeletal:     Cervical back: Normal range of motion and neck supple.  Skin:    General: Skin is warm and dry.     Capillary Refill: Capillary refill takes less than 2 seconds.  Neurological:     Mental Status: He is alert and oriented to person, place, and time.  Psychiatric:        Behavior: Behavior normal.      Musculoskeletal Exam: C-spine, thoracic spine, lumbar spine good range of motion.  No midline spinal tenderness.  Shoulder joints, elbow joints, wrist joints, MCPs, PIPs, DIPs good range of motion with no synovitis.  He has complete fist formation bilaterally.  He has PIP and DIP thickening consistent with osteoarthritis of both hands.  Hip joints have good range of motion with no discomfort.  Knee joints have good range of motion with no warmth or effusion.  He has bilateral knee crepitus.  Ankle joints have good range of motion with no tenderness or inflammation.  CDAI Exam: CDAI Score: -- Patient Global: --; Provider Global: -- Swollen: --; Tender: -- Joint Exam 10/11/2019   No joint exam has been documented for this visit   There is currently no information documented on the homunculus. Go to the Rheumatology activity and complete the homunculus joint exam.  Investigation: No additional findings.  Imaging: No results found.  Recent Labs: Lab Results  Component Value Date   WBC 6.7 08/09/2019   HGB 11.6 (L) 08/09/2019   PLT 303 08/09/2019   NA 136 08/09/2019   K 4.0 08/09/2019   CL 101 08/09/2019   CO2 24 08/09/2019   GLUCOSE 124 (H) 08/09/2019   BUN 16 08/09/2019   CREATININE 1.13 08/09/2019   BILITOT 1.2 08/09/2019   ALKPHOS 40 08/09/2019   AST 54 (H) 08/09/2019   ALT 44 08/09/2019   PROT 6.7 08/09/2019   ALBUMIN 3.3 (L) 08/09/2019   CALCIUM 8.2 (L) 08/09/2019   GFRAA >60 08/09/2019     Speciality Comments: No specialty comments available.  Procedures:  No procedures performed Allergies: Patient has no known allergies.   Assessment / Plan:     Visit Diagnoses: Idiopathic chronic gout of multiple sites without tophus -He has not had any recent gout flares.  He is clinically doing well on allopurinol 300 mg 1 tablet by mouth daily colchicine 0.6 mg 1 tablet as needed during gout flares.  He is not having any joint pain or inflammation at this time.  He has stopped drinking beer which she feels is contributing to less frequent flares and his uric acid level being within normal limits.  His uric acid level was 5.7 on 10/07/2019.  He will continue taking allopurinol and colchicine as prescribed.  He does  not need any refills at this time.  He was advised to notify us if he develops signs or symptoms of a gout flare.  He was encouraged to continue to avoid a purine rich diet and alcohol use.  He will follow-up in the office in 6 months.  Primary osteoarthritis of both hands: He has PIP and DIP thickening consistent with osteoarthritis of both hands.  He has complete fist formation bilaterally.  No tenderness or synovitis was noted.  Joint protection and muscle strengthening were discussed.  Primary osteoarthritis of both knees - x-ray showed moderate osteoarthritis in the right knee joint and moderate to severe osteoarthritis in the left knee joint. Bilateral mild chondromalacia patella: He has good range of motion of both knee joints.  No warmth or effusion was noted.  He has bilateral knee crepitus.  He has been experiencing increased discomfort in both knee joints after walking for long distances.  He walks several miles per day for exercise.  He had SynviscOne injections in both knee joints (left knee on 12/31/2018 and right knee 01/01/2019).  He noted significant clinical improvement after Synvisc injections for both knees.  He presents today for his first Synvisc injection for both  knee joints.  He tolerated the procedure well.  Aftercare was discussed.  He returned next week in the following week to complete the series.- Plan: Large Joint Inj: bilateral knee  Orders: Orders Placed This Encounter  Procedures  . Large Joint Inj: bilateral knee   No orders of the defined types were placed in this encounter.   Follow-Up Instructions: Return in about 5 months (around 03/12/2020) for Gout, Osteoarthritis.   Gearldine Bienenstock, PA-C  Note - This record has been created using Dragon software.  Chart creation errors have been sought, but may not always  have been located. Such creation errors do not reflect on  the standard of medical care.

## 2019-10-07 ENCOUNTER — Other Ambulatory Visit: Payer: Self-pay | Admitting: *Deleted

## 2019-10-07 DIAGNOSIS — M1A09X0 Idiopathic chronic gout, multiple sites, without tophus (tophi): Secondary | ICD-10-CM

## 2019-10-07 LAB — URIC ACID: Uric Acid, Serum: 5.7 mg/dL (ref 4.0–8.0)

## 2019-10-11 ENCOUNTER — Encounter: Payer: Self-pay | Admitting: Physician Assistant

## 2019-10-11 ENCOUNTER — Other Ambulatory Visit: Payer: Self-pay

## 2019-10-11 ENCOUNTER — Ambulatory Visit: Payer: BC Managed Care – PPO | Admitting: Physician Assistant

## 2019-10-11 VITALS — BP 116/76 | HR 64 | Resp 15 | Ht 74.0 in | Wt 255.8 lb

## 2019-10-11 DIAGNOSIS — M19042 Primary osteoarthritis, left hand: Secondary | ICD-10-CM

## 2019-10-11 DIAGNOSIS — M170 Bilateral primary osteoarthritis of knee: Secondary | ICD-10-CM

## 2019-10-11 DIAGNOSIS — M19041 Primary osteoarthritis, right hand: Secondary | ICD-10-CM | POA: Diagnosis not present

## 2019-10-11 DIAGNOSIS — M1A09X0 Idiopathic chronic gout, multiple sites, without tophus (tophi): Secondary | ICD-10-CM | POA: Diagnosis not present

## 2019-10-11 MED ORDER — LIDOCAINE HCL 1 % IJ SOLN
1.5000 mL | INTRAMUSCULAR | Status: AC | PRN
  Administered 2019-10-11: 1.5 mL

## 2019-10-11 MED ORDER — HYLAN G-F 20 16 MG/2ML IX SOSY
16.0000 mg | PREFILLED_SYRINGE | INTRA_ARTICULAR | Status: AC | PRN
  Administered 2019-10-11: 16 mg via INTRA_ARTICULAR

## 2019-10-11 NOTE — Progress Notes (Signed)
   Procedure Note  Patient: Dylan Obrien             Date of Birth: [birth year]           MRN: [id number]             Visit Date: 10/11/2019  Procedures: Visit Diagnoses:  Synvisc #1 bilateral knees, B/B  Large Joint Inj: bilateral knee on 10/11/2019 11:47 AM Indications: pain Details: 25 G 1.5 in needle, medial approach  Arthrogram: No  Medications (Right): 1.5 mL lidocaine 1 %; 16 mg Hylan 16 MG/2ML Aspirate (Right): 0 mL Medications (Left): 1.5 mL lidocaine 1 %; 16 mg Hylan 16 MG/2ML Aspirate (Left): 0 mL Outcome: tolerated well, no immediate complications Procedure, treatment alternatives, risks and benefits explained, specific risks discussed. Consent was given by the patient. Immediately prior to procedure a time out was called to verify the correct patient, procedure, equipment, support staff and site/side marked as required. Patient was prepped and draped in the usual sterile fashion.     Patient tolerated the procedure well.  Aftercare was discussed.  Sherron Ales, PA-C

## 2019-10-11 NOTE — Progress Notes (Signed)
Uric acid is in desirable range.

## 2019-10-18 ENCOUNTER — Ambulatory Visit (INDEPENDENT_AMBULATORY_CARE_PROVIDER_SITE_OTHER): Payer: BC Managed Care – PPO | Admitting: Physician Assistant

## 2019-10-18 ENCOUNTER — Other Ambulatory Visit: Payer: Self-pay

## 2019-10-18 DIAGNOSIS — M170 Bilateral primary osteoarthritis of knee: Secondary | ICD-10-CM

## 2019-10-18 MED ORDER — HYLAN G-F 20 16 MG/2ML IX SOSY
16.0000 mg | PREFILLED_SYRINGE | INTRA_ARTICULAR | Status: AC | PRN
  Administered 2019-10-18: 16 mg via INTRA_ARTICULAR

## 2019-10-18 MED ORDER — LIDOCAINE HCL 1 % IJ SOLN
1.5000 mL | INTRAMUSCULAR | Status: AC | PRN
  Administered 2019-10-18: 1.5 mL

## 2019-10-18 NOTE — Progress Notes (Signed)
   Procedure Note  Patient: Dylan Obrien             Date of Birth: [birth year]           MRN: [id number]             Visit Date: 10/18/2019  Procedures: Visit Diagnoses:  1. Primary osteoarthritis of both knees   Synvisc #2 Bilateral knee joint injections   Large Joint Inj: bilateral knee on 10/18/2019 8:42 AM Indications: pain Details: 25 G 1.5 in needle, medial approach  Arthrogram: No  Medications (Right): 1.5 mL lidocaine 1 %; 16 mg Hylan 16 MG/2ML Aspirate (Right): 0 mL Medications (Left): 1.5 mL lidocaine 1 %; 16 mg Hylan 16 MG/2ML Aspirate (Left): 0 mL Outcome: tolerated well, no immediate complications Procedure, treatment alternatives, risks and benefits explained, specific risks discussed. Consent was given by the patient. Immediately prior to procedure a time out was called to verify the correct patient, procedure, equipment, support staff and site/side marked as required. Patient was prepped and draped in the usual sterile fashion.     Patient tolerated the procedure well. Aftercare was discussed.  Sherron Ales, PA-C

## 2019-10-25 ENCOUNTER — Other Ambulatory Visit: Payer: Self-pay

## 2019-10-25 ENCOUNTER — Ambulatory Visit (INDEPENDENT_AMBULATORY_CARE_PROVIDER_SITE_OTHER): Payer: BC Managed Care – PPO | Admitting: Physician Assistant

## 2019-10-25 DIAGNOSIS — M1712 Unilateral primary osteoarthritis, left knee: Secondary | ICD-10-CM | POA: Diagnosis not present

## 2019-10-25 DIAGNOSIS — M170 Bilateral primary osteoarthritis of knee: Secondary | ICD-10-CM

## 2019-10-25 DIAGNOSIS — M1711 Unilateral primary osteoarthritis, right knee: Secondary | ICD-10-CM

## 2019-10-25 MED ORDER — HYLAN G-F 20 16 MG/2ML IX SOSY
16.0000 mg | PREFILLED_SYRINGE | INTRA_ARTICULAR | Status: AC | PRN
  Administered 2019-10-25: 16 mg via INTRA_ARTICULAR

## 2019-10-25 MED ORDER — LIDOCAINE HCL 1 % IJ SOLN
1.5000 mL | INTRAMUSCULAR | Status: AC | PRN
  Administered 2019-10-25: 1.5 mL

## 2019-10-25 NOTE — Progress Notes (Signed)
   Procedure Note  Patient: Jennifer Holland             Date of Birth: 05-16-1966           MRN: [id number]             Visit Date: 10/25/2019  Procedures: Visit Diagnoses:  1. Primary osteoarthritis of both knees    Synvisc #3 Bilateral knee joint injections   Large Joint Inj: bilateral knee on 10/25/2019 2:12 PM Indications: pain Details: 25 G 1.5 in needle, medial approach  Arthrogram: No  Medications (Right): 1.5 mL lidocaine 1 %; 16 mg Hylan 16 MG/2ML Aspirate (Right): 0 mL Medications (Left): 1.5 mL lidocaine 1 %; 16 mg Hylan 16 MG/2ML Aspirate (Left): 0 mL Outcome: tolerated well, no immediate complications Procedure, treatment alternatives, risks and benefits explained, specific risks discussed. Consent was given by the patient. Immediately prior to procedure a time out was called to verify the correct patient, procedure, equipment, support staff and site/side marked as required. Patient was prepped and draped in the usual sterile fashion.      Patient tolerated the procedure well.  Aftercare was discussed.  Sherron Ales, PA-C

## 2019-12-03 ENCOUNTER — Telehealth: Payer: Self-pay | Admitting: Rheumatology

## 2019-12-03 MED ORDER — ALLOPURINOL 300 MG PO TABS: 300.0000 mg | ORAL_TABLET | Freq: Every day | ORAL | 0 refills | Status: DC

## 2019-12-03 NOTE — Telephone Encounter (Signed)
Please schedule patient for a follow up visit. Patient due December 2021. Thanks!  

## 2019-12-03 NOTE — Telephone Encounter (Signed)
Patient left a voicemail requesting prescription refill of Allopurinol to be sent to [facility] at [address] [facility].

## 2019-12-03 NOTE — Telephone Encounter (Signed)
Last Visit: 10/11/2019 Next Visit: due September 2021 Message sent to the front to schedule  Labs: 08/09/2019 Glucose 124, Calcium 8.2, Albumin 3.3 AST 54, RBC 3.66, Hgb 11.6, Hct 33.7 Uric Acid 5.7 on 10/07/2019  Current Dose per office note 10/11/2019: allopurinol 300 mg 1 tablet by mouth daily   Okay to refill per Dr. Corliss Skains

## 2019-12-03 NOTE — Telephone Encounter (Signed)
[facility] for patient to call and schedule follow-up appointment in December 2021.

## 2020-03-04 ENCOUNTER — Other Ambulatory Visit: Payer: Self-pay | Admitting: Rheumatology

## 2020-03-06 NOTE — Progress Notes (Signed)
Office Visit Note  Patient: Dylan Obrien             Date of Birth: [birth year]           MRN: [id number]             PCP: Jarome Matin, MD Referring: Jarome Matin, MD Visit Date: 03/10/2020 Occupation: @GUAROCC @  Subjective:  Pain in both knee joints   History of Present Illness: Akshar Starnes is a 54 y.o. male with history of gout and osteoarthritis.  He is currently taking allopurinol 300 mg daily and colchicine 0.6 mg 1 tablets daily as needed during gout flares.  He denies any recent gout flares.  He has not had to take colchicine recently.  He states he has been noticing some increased discomfort in both knees and would like to reapply for Visco gel injections for both knees.  We will also be updating x-rays today.  He denies any new concerns at this time.  He denies any other joint pain or joint swelling at this time.    Activities of Daily Living:  Patient reports morning stiffness for 0  minutes.   Patient Denies nocturnal pain.  Difficulty dressing/grooming: Denies Difficulty climbing stairs: Denies Difficulty getting out of chair: Denies Difficulty using hands for taps, buttons, cutlery, and/or writing: Denies  Review of Systems  Constitutional: Negative for fatigue and night sweats.  HENT: Negative for mouth sores, mouth dryness and nose dryness.   Eyes: Negative for redness and dryness.  Respiratory: Negative for shortness of breath and difficulty breathing.   Cardiovascular: Negative for chest pain, palpitations, hypertension, irregular heartbeat and swelling in legs/feet.  Gastrointestinal: Negative for constipation and diarrhea.  Endocrine: Negative for increased urination.  Genitourinary: Negative for painful urination.  Musculoskeletal: Negative for arthralgias, joint pain, joint swelling, myalgias, muscle weakness, morning stiffness, muscle tenderness and myalgias.  Skin: Negative for color change, rash, hair loss, nodules/bumps, skin tightness,  ulcers and sensitivity to sunlight.  Allergic/Immunologic: Negative for susceptible to infections.  Neurological: Negative for dizziness, fainting, memory loss, night sweats and weakness.  Hematological: Negative for swollen glands.  Psychiatric/Behavioral: Negative for depressed mood and sleep disturbance. The patient is not nervous/anxious.     PMFS History:  Patient Active Problem List   Diagnosis Date Noted  . Primary osteoarthritis of both knees 10/14/2016  . Primary osteoarthritis of both hands 10/14/2016  . Gout   . Hx of cardiovascular stress test   . Chest pain 04/23/2012    Past Medical History:  Diagnosis Date  . Allergy   . Arthritis   . Chest pain 04/23/2012  . Gout   . Hx of cardiovascular stress test    a. ETT 11/13 with borderline ST changes => b. ETT-Myoview:  26mm ST depression V3-6 on ECG (false +), images with no ischemia, EF 58%    Family History  Problem Relation Age of Onset  . Cancer Sister        brain  . Colon cancer Neg Hx   . Esophageal cancer Neg Hx   . Rectal cancer Neg Hx   . Stomach cancer Neg Hx    Past Surgical History:  Procedure Laterality Date  . EXTERNAL FIXATION ARM  1989   Social History   Social History Narrative  . Not on file    There is no immunization history on file for this patient.   Objective: Vital Signs: Wt 265 lb (120.2 kg)   BMI 34.02 kg/m    Physical Exam  Vitals and nursing note reviewed.  Constitutional:      Appearance: He is well-developed.  HENT:     Head: Normocephalic and atraumatic.  Eyes:     Conjunctiva/sclera: Conjunctivae normal.     Pupils: Pupils are equal, round, and reactive to light.  Pulmonary:     Effort: Pulmonary effort is normal.  Abdominal:     Palpations: Abdomen is soft.  Musculoskeletal:     Cervical back: Normal range of motion and neck supple.  Skin:    General: Skin is warm and dry.     Capillary Refill: Capillary refill takes less than 2 seconds.  Neurological:      Mental Status: He is alert and oriented to person, place, and time.  Psychiatric:        Behavior: Behavior normal.      Musculoskeletal Exam: C-spine, thoracic spine, and lumbar spine have good range of motion.  Shoulder joints, with joints, wrist joints, MCPs, PIPs, DIPs have good range of motion with no synovitis.  He has DIP thickening consistent with osteoarthritis of both hands.  He has complete fist formation bilaterally.  Hip joints have good range of motion with no discomfort.  Knee joints have good range of motion with no warmth or effusion.  Crepitus in the right knee joint noted.  Ankle joints have good range of motion with no tenderness or inflammation.   CDAI Exam: CDAI Score: -- Patient Global: --; Provider Global: -- Swollen: --; Tender: -- Joint Exam 03/10/2020   No joint exam has been documented for this visit   There is currently no information documented on the homunculus. Go to the Rheumatology activity and complete the homunculus joint exam.  Investigation: No additional findings.  Imaging: No results found.  Recent Labs: Lab Results  Component Value Date   WBC 6.7 08/09/2019   HGB 11.6 (L) 08/09/2019   PLT 303 08/09/2019   NA 136 08/09/2019   K 4.0 08/09/2019   CL 101 08/09/2019   CO2 24 08/09/2019   GLUCOSE 124 (H) 08/09/2019   BUN 16 08/09/2019   CREATININE 1.13 08/09/2019   BILITOT 1.2 08/09/2019   ALKPHOS 40 08/09/2019   AST 54 (H) 08/09/2019   ALT 44 08/09/2019   PROT 6.7 08/09/2019   ALBUMIN 3.3 (L) 08/09/2019   CALCIUM 8.2 (L) 08/09/2019   GFRAA >60 08/09/2019    Speciality Comments: No specialty comments available.  Procedures:  No procedures performed Allergies: Patient has no known allergies.   Assessment / Plan:     Visit Diagnoses: Idiopathic chronic gout of multiple sites without tophus -He has not had any recent gout flares.  He is clinically doing well on allopurinol 300 mg 1 tablet by mouth daily and colchicine 0.6 mg 1  tablet by mouth daily as needed during a flare.  He has not needed to take colchicine recently.  He is not experiencing any joint pain or inflammation at this time.  His uric acid level was 5.7 on 10/07/2019 which is within the desirable range.  He will continue taking allopurinol as prescribed.  He would like a refill of colchicine to have on hand in case he develops signs or symptoms of a flare.  He was advised to notify us if he develops symptoms of a gout flare.  He will follow-up in the office in 6 months. - Plan: COMPLETE METABOLIC PANEL WITH GFR, CBC with Differential/Platelet, Uric acid  Medication monitoring encounter -Uric acid was 5.7 on 10/07/2019 which is within  the desirable range.  CBC and CMP were drawn on 08/09/2019.  He is due to update lab work.  Future orders for CBC, CMP, and uric acid level will be placed today.   plan: COMPLETE METABOLIC PANEL WITH GFR, CBC with Differential/Platelet, Uric acid  Primary osteoarthritis of both hands: He has DIP thickening consistent with osteoarthritis of both hands.  No tenderness or inflammation was noted.  He has complete fist formation bilaterally.  He is not experiencing any discomfort or stiffness in his hands at this time.  Primary osteoarthritis of both knees - Previous x-rays showed moderate osteoarthritis in the right knee joint and moderate to severe osteoarthritis in the left knee joint. He had synvisc injections for bilateral knees performed on 10/2019, which provided significant pain relief.  His discomfort has started to return and he would like to reapply for Visco gel injections for both knees.  X-rays of both knees were updated today.  Chronic pain of both knees -He has been experiencing intermittent discomfort in both knee joints.  He has not noticed any joint swelling recently.  No mechanical symptoms.  He has not had any recent injuries or falls.  He would like to reapply for Visco gel injections for both knees.  X-rays of both knee joints  were updated today.  We will notify him once the injections have been approved and schedule the series.  Plan: XR KNEE 3 VIEW RIGHT, XR KNEE 3 VIEW LEFT   Orders: Orders Placed This Encounter  Procedures  . XR KNEE 3 VIEW RIGHT  . XR KNEE 3 VIEW LEFT  . COMPLETE METABOLIC PANEL WITH GFR  . CBC with Differential/Platelet  . Uric acid   Meds ordered this encounter  Medications  . Colchicine (MITIGARE) 0.6 MG CAPS    Sig: Take 0.6 mg by mouth daily as needed.    Dispense:  30 capsule    Refill:  5    Follow-Up Instructions: Return in about 6 months (around 09/07/2020) for Gout, Osteoarthritis.   Gearldine Bienenstock, PA-C  Note - This record has been created using Dragon software.  Chart creation errors have been sought, but may not always  have been located. Such creation errors do not reflect on  the standard of medical care.

## 2020-03-06 NOTE — Telephone Encounter (Addendum)
Last Visit: 10/11/2019 Next Visit: 03/10/2020 Labs: 08/09/2019 Glucose 124, Calcium 8.2, Albumin 3.3 AST 54 RBC 3.66, Hgb 11.6, Hct 33.7, nRBC 0.3 Absolute Immature Granulocytes 0.11 URic Acid 5.7  Current Dose per office note 10/11/2019: allopurinol 300 mg 1 tablet by mouth daily  DX:  Idiopathic chronic gout of multiple sites without tophus   Okay to refill Allopurinol?   Patient is also interested in Visco. Patient is interested in a one series injection. Okay to apply for Visco?

## 2020-03-06 NOTE — Telephone Encounter (Signed)
Ok to refill allopurinol.   Ok to apply for synviscOne injections for both knees. We will update x-rays at his appointment on 03/10/20.

## 2020-03-10 ENCOUNTER — Ambulatory Visit (INDEPENDENT_AMBULATORY_CARE_PROVIDER_SITE_OTHER): Payer: BC Managed Care – PPO | Admitting: Physician Assistant

## 2020-03-10 ENCOUNTER — Ambulatory Visit: Payer: Self-pay

## 2020-03-10 ENCOUNTER — Other Ambulatory Visit: Payer: Self-pay

## 2020-03-10 ENCOUNTER — Encounter: Payer: Self-pay | Admitting: Physician Assistant

## 2020-03-10 VITALS — BP 133/83 | HR 49 | Resp 16 | Ht 74.0 in | Wt 265.0 lb

## 2020-03-10 DIAGNOSIS — M25562 Pain in left knee: Secondary | ICD-10-CM

## 2020-03-10 DIAGNOSIS — M25561 Pain in right knee: Secondary | ICD-10-CM

## 2020-03-10 DIAGNOSIS — M1A09X0 Idiopathic chronic gout, multiple sites, without tophus (tophi): Secondary | ICD-10-CM | POA: Diagnosis not present

## 2020-03-10 DIAGNOSIS — M19041 Primary osteoarthritis, right hand: Secondary | ICD-10-CM | POA: Diagnosis not present

## 2020-03-10 DIAGNOSIS — M19042 Primary osteoarthritis, left hand: Secondary | ICD-10-CM

## 2020-03-10 DIAGNOSIS — G8929 Other chronic pain: Secondary | ICD-10-CM

## 2020-03-10 DIAGNOSIS — M170 Bilateral primary osteoarthritis of knee: Secondary | ICD-10-CM | POA: Diagnosis not present

## 2020-03-10 DIAGNOSIS — Z5181 Encounter for therapeutic drug level monitoring: Secondary | ICD-10-CM

## 2020-03-10 MED ORDER — COLCHICINE 0.6 MG PO CAPS: 0.6000 mg | ORAL_CAPSULE | Freq: Every day | ORAL | 5 refills | Status: DC | PRN

## 2020-03-14 NOTE — Telephone Encounter (Signed)
I spoke with patient in regards to Visco injections. PA has not been obtained as of yet. Patient is not eligible for repeat injections for 6 months after last series, which would be after 04/11/2020. Per patient current insurance will end 04/06/2020. Patient will notify us of insurance change once he receives it so we can proceed with Visco approval.

## 2020-05-28 ENCOUNTER — Other Ambulatory Visit: Payer: Self-pay | Admitting: Physician Assistant

## 2020-05-29 NOTE — Telephone Encounter (Addendum)
Last Visit: 03/10/2020 Next Visit: due March 2022. Message sent to the front to schedule.  Labs: Uric acid was 5.7 on 10/07/2019 which is within the desirable range.  CBC and CMP were drawn on 08/09/2019  Current Dose per office note 03/10/2020: allopurinol 300 mg 1 tablet by mouth daily  DX: Idiopathic chronic gout of multiple sites without tophus   Patient advised he is due to update labs. Patient states he will try to update this week.  Okay to refill Allopurinol?

## 2020-06-06 ENCOUNTER — Other Ambulatory Visit: Payer: Self-pay

## 2020-06-06 DIAGNOSIS — Z5181 Encounter for therapeutic drug level monitoring: Secondary | ICD-10-CM

## 2020-06-06 DIAGNOSIS — M1A09X0 Idiopathic chronic gout, multiple sites, without tophus (tophi): Secondary | ICD-10-CM

## 2020-06-07 LAB — COMPLETE METABOLIC PANEL WITH GFR
AG Ratio: 1.6 (calc) (ref 1.0–2.5)
ALT: 21 U/L (ref 9–46)
AST: 17 U/L (ref 10–35)
Albumin: 4.5 g/dL (ref 3.6–5.1)
Alkaline phosphatase (APISO): 51 U/L (ref 35–144)
BUN/Creatinine Ratio: 24 (calc) — ABNORMAL HIGH (ref 6–22)
BUN: 28 mg/dL — ABNORMAL HIGH (ref 7–25)
CO2: 27 mmol/L (ref 20–32)
Calcium: 9.7 mg/dL (ref 8.6–10.3)
Chloride: 103 mmol/L (ref 98–110)
Creat: 1.19 mg/dL (ref 0.70–1.33)
GFR, Est African American: 79 mL/min/{1.73_m2} (ref >=60)
GFR, Est Non African American: 68 mL/min/{1.73_m2} (ref >=60)
Globulin: 2.8 g/dL (calc) (ref 1.9–3.7)
Glucose, Bld: 93 mg/dL (ref 65–139)
Potassium: 4.4 mmol/L (ref 3.5–5.3)
Sodium: 139 mmol/L (ref 135–146)
Total Bilirubin: 0.6 mg/dL (ref 0.2–1.2)
Total Protein: 7.3 g/dL (ref 6.1–8.1)

## 2020-06-07 LAB — CBC WITH DIFFERENTIAL/PLATELET
Absolute Monocytes: 529 cells/uL (ref 200–950)
Basophils Absolute: 32 cells/uL (ref 0–200)
Basophils Relative: 0.6 %
Eosinophils Absolute: 211 cells/uL (ref 15–500)
Eosinophils Relative: 3.9 %
HCT: 42.3 % (ref 38.5–50.0)
Hemoglobin: 14.7 g/dL (ref 13.2–17.1)
Lymphs Abs: 1210 cells/uL (ref 850–3900)
MCH: 31.5 pg (ref 27.0–33.0)
MCHC: 34.8 g/dL (ref 32.0–36.0)
MCV: 90.8 fL (ref 80.0–100.0)
MPV: 11 fL (ref 7.5–12.5)
Monocytes Relative: 9.8 %
Neutro Abs: 3418 cells/uL (ref 1500–7800)
Neutrophils Relative %: 63.3 %
Platelets: 211 10*3/uL (ref 140–400)
RBC: 4.66 10*6/uL (ref 4.20–5.80)
RDW: 12.7 % (ref 11.0–15.0)
Total Lymphocyte: 22.4 %
WBC: 5.4 10*3/uL (ref 3.8–10.8)

## 2020-06-07 LAB — URIC ACID: Uric Acid, Serum: 6.4 mg/dL (ref 4.0–8.0)

## 2020-06-07 NOTE — Progress Notes (Signed)
BUN is mildly elevated.  Creatinine and GFR WNL.   CBC WNL.   Uric acid is 6.4-ideally his uric acid level should be less than 6.  Please clarify if he has been taking allopurinol as prescribed.   Please also advise him to avoid a purine rich diet and high fructose corn syrup.

## 2020-06-16 ENCOUNTER — Telehealth: Payer: Self-pay | Admitting: Rheumatology

## 2020-06-16 NOTE — Telephone Encounter (Signed)
Please call to schedule Visco knee injections.  Authorized for [employer] series bilateral knees. Buy and Bill. No PA required.  Once deductible is met, insurance to cover 80% of allowable cost with no copay. (as of date $0.00 has been met of $5000.00) Once OOP is met coverage goes up to 100% of allowable cost coverage.

## 2020-06-30 ENCOUNTER — Other Ambulatory Visit: Payer: Self-pay | Admitting: Physician Assistant

## 2020-07-03 NOTE — Telephone Encounter (Signed)
Last Visit: 03/10/2020 Next Visit: Due 09/2020 Labs: 06/06/2020 Uric acid is 6.4-ideally his uric acid level should be less than 6. Patient states he has been taking allopurinol as prescribed.    Current Dose per office note 03/10/2020: Allopurinol 300 mg 1 tablet by mouth daily  DX: Idiopathic chronic gout of multiple sites without tophus  Okay to refill Allopurinol?

## 2020-12-06 HISTORY — PX: ROTATOR CUFF REPAIR: SHX139

## 2020-12-20 ENCOUNTER — Encounter (HOSPITAL_BASED_OUTPATIENT_CLINIC_OR_DEPARTMENT_OTHER): Payer: Self-pay

## 2020-12-20 ENCOUNTER — Ambulatory Visit (HOSPITAL_BASED_OUTPATIENT_CLINIC_OR_DEPARTMENT_OTHER): Admit: 2020-12-20 | Payer: Self-pay | Admitting: Orthopaedic Surgery

## 2020-12-20 SURGERY — SHOULDER ARTHROSCOPY WITH ROTATOR CUFF REPAIR AND SUBACROMIAL DECOMPRESSION
Anesthesia: Choice | Site: Shoulder | Laterality: Right

## 2021-03-26 ENCOUNTER — Telehealth: Payer: Self-pay

## 2021-03-26 NOTE — Telephone Encounter (Signed)
Patient called stating he would like to apply for gel injections for his bilateral knees.

## 2021-03-27 NOTE — Telephone Encounter (Signed)
Ok to reapply for visco gel injections for both knees.

## 2021-03-27 NOTE — Telephone Encounter (Signed)
Submitted for VOB 03/27/2021.

## 2021-04-12 NOTE — Telephone Encounter (Signed)
Synvisc is not a preferred drug with patient's insurance. Submitted for VOB with Euflexxa 04/12/2021.

## 2021-04-13 NOTE — Telephone Encounter (Signed)
Please call patient to schedule Visco Knee injections.  Authorized for [employer] series Bilateral knees. Buy and Bill. Deductible and OOP has been met.  No PA required. Insurance to cover 100% of allowable cost. No copay required.

## 2021-04-17 ENCOUNTER — Encounter: Payer: Self-pay | Admitting: *Deleted

## 2021-04-18 ENCOUNTER — Other Ambulatory Visit: Payer: Self-pay

## 2021-04-18 ENCOUNTER — Ambulatory Visit: Payer: 59 | Admitting: Neurology

## 2021-04-18 ENCOUNTER — Encounter: Payer: Self-pay | Admitting: Neurology

## 2021-04-18 VITALS — BP 137/81 | HR 44 | Ht 69.0 in | Wt 269.0 lb

## 2021-04-18 DIAGNOSIS — E669 Obesity, unspecified: Secondary | ICD-10-CM

## 2021-04-18 DIAGNOSIS — R351 Nocturia: Secondary | ICD-10-CM

## 2021-04-18 DIAGNOSIS — G4719 Other hypersomnia: Secondary | ICD-10-CM | POA: Diagnosis not present

## 2021-04-18 DIAGNOSIS — R0683 Snoring: Secondary | ICD-10-CM

## 2021-04-18 DIAGNOSIS — R635 Abnormal weight gain: Secondary | ICD-10-CM | POA: Diagnosis not present

## 2021-04-18 DIAGNOSIS — R519 Headache, unspecified: Secondary | ICD-10-CM

## 2021-04-18 DIAGNOSIS — R7989 Other specified abnormal findings of blood chemistry: Secondary | ICD-10-CM

## 2021-04-18 NOTE — Patient Instructions (Signed)

## 2021-04-18 NOTE — Progress Notes (Signed)
Subjective:    Patient ID: Dylan Obrien is a 55 y.o. male.  HPI    Huston Foley, MD, PhD [facility] [address], [address] [address] [place], [place] [address]  Dear Dr. Eloise Harman,  I saw your patient, Dylan Obrien, upon your kind request in my neurologic clinic today for initial consultation of his sleep disorder, in particular, concern for underlying obstructive sleep apnea. The patient is unaccompanied today. As you know, Dylan Obrien is a 55 year old right-handed gentleman with an underlying medical history of allergic rhinitis, atypical chest pain, gout, status post surgery for left wrist fracture in 2017, and obesity, who reports snoring and excessive daytime somnolence.  He reports that his daytime energy has declined over the past few years.  He has had slow weight gain over the past 3 years but probably in the realm of 30 pounds at this point.  He was diagnosed with low testosterone and hormone replacement has been discussed in the past but he has not been on hormone replacement as yet.  He was recently treated for alcohol use disorder with naltrexone.  He found it effective.  He has reduced his regular alcohol consumption to occasional, up to 3 times a week.  He is a non-smoker.  He drinks caffeine in the form of coffee, about 4 cups in the morning and 1 soda per day.  He has no obvious family history of sleep apnea.  He does snore loudly enough to disturb his wife.  They do have a TV on in the bedroom but he does not care for it.  It is typically preferred by his wife.  She does turn it off before falling asleep.  They have 1 dog in the household that sleeps typically on the bed.  They have 2 daughters, both are currently in college at [employer].  He works as an Art gallery manager.  He has a bedtime of 10 to 10:30 PM and rise time of 6:30 AM.  He has nocturia about once per average night and has occasionally woken up with a dull, achy headache.  It does not last  long and he does not take any medication for this.   I reviewed your office note from 11/14/20, which you kindly included. His Epworth sleepiness score is 9 out of 24, fatigue severity score is 16 out of 63.  He is currently only on allopurinol on a day-to-day basis and has a prescription for colchicine as needed.  His Past Medical History Is Significant For: Past Medical History:  Diagnosis Date   Allergy    Arthritis    Chest pain 04/23/2012   Gout    Hx of cardiovascular stress test    a. ETT 11/13 with borderline ST changes => b. ETT-Myoview:  36mm ST depression V3-6 on ECG (false +), images with no ischemia, EF 58%    His Past Surgical History Is Significant For: Past Surgical History:  Procedure Laterality Date   EXTERNAL FIXATION ARM  07/09/1987   ORIF WRIST FRACTURE Left     His Family History Is Significant For: Family History  Problem Relation Age of Onset   Breast cancer Mother    Anemia Father    Cancer Sister        brain   Colon cancer Neg Hx    Esophageal cancer Neg Hx    Rectal cancer Neg Hx    Stomach cancer Neg Hx     His Social History Is Significant For: Social History  Socioeconomic History   Marital status: Married    Spouse name: Not on file   Number of children: 2   Years of education: Not on file   Highest education level: Not on file  Occupational History    Employer: [employer]    Comment: Chemist/engineer  Tobacco Use   Smoking status: Never   Smokeless tobacco: Never  Vaping Use   Vaping Use: Never used  Substance and Sexual Activity   Alcohol use: Yes    Comment: i drink daily   Drug use: No   Sexual activity: Not on file  Other Topics Concern   Not on file  Social History Narrative   Caffeine: 4 cups daily.   Education: Medical laboratory scientific officer, work: Doctor, hospital.    Social Determinants of Health   Financial Resource Strain: Not on file  Food Insecurity: Not on file  Transportation Needs: Not on file  Physical  Activity: Not on file  Stress: Not on file  Social Connections: Not on file    His Allergies Are:  Allergies  Allergen Reactions   Eggs Or Egg-Derived Products     Per pt, stated allergist, but he has had flu shot and no problem.  :   His Current Medications Are:  Outpatient Encounter Medications as of 04/18/2021  Medication Sig   allopurinol (ZYLOPRIM) 300 MG tablet Take 1 tablet by mouth daily.   azelastine (ASTELIN) 0.1 % nasal spray Place 1 spray into both nostrils as needed.    Cyanocobalamin (VITAMIN B-12 PO) Take by mouth. 1000 mcg daily   Omega-3 Fatty Acids (FISH OIL PO) Take 1 capsule by mouth daily.   Colchicine (MITIGARE) 0.6 MG CAPS Take 0.6 mg by mouth daily as needed. (Patient not taking: Reported on 04/18/2021)   VITAMIN D PO Take by mouth daily. (Patient not taking: No sig reported)   No facility-administered encounter medications on file as of 04/18/2021.  :   Review of Systems:  Out of a complete 14 point review of systems, all are reviewed and negative with the exception of these symptoms as listed below:   Review of Systems  Neurological:        Fatigue, daytime sleepiness, snores.  ESS 9, FSS -16   Objective:  Neurological Exam  Physical Exam Physical Examination:   Vitals:   04/18/21 1453  BP: 137/81  Pulse: (!) 44    General Examination: The patient is a very pleasant 55 y.o. male in no acute distress. He appears well-developed and well-nourished and well groomed.   HEENT: Normocephalic, atraumatic, pupils are equal, round and reactive to light, extraocular tracking is good without limitation to gaze excursion or nystagmus noted. Hearing is grossly intact. Face is symmetric with normal facial animation. Speech is clear with no dysarthria noted. There is no hypophonia. There is no lip, neck/head, jaw or voice tremor. Neck is supple with full range of passive and active motion. There are no carotid bruits on auscultation. Oropharynx exam reveals:  mild mouth dryness, adequate dental hygiene and moderate airway crowding, due to small airway entry and elongated uvula.  Tonsillar size of about 1-2+ bilaterally.  Mallampati class III.  Neck circumference of 17 1 8  inches.  He has a mild overbite.  Tongue protrudes centrally and palate elevates symmetrically.    Chest: Clear to auscultation without wheezing, rhonchi or crackles noted.  Heart: S1+S2+0, regular and normal without murmurs, rubs or gallops noted.   Abdomen: Soft, non-tender and non-distended.  Extremities: There is no  pitting edema in the distal lower extremities bilaterally.   Skin: Warm and dry without trophic changes noted.   Musculoskeletal: exam reveals no obvious joint deformities.   Neurologically:  Mental status: The patient is awake, alert and oriented in all 4 spheres. His immediate and remote memory, attention, language skills and fund of knowledge are appropriate. There is no evidence of aphasia, agnosia, apraxia or anomia. Speech is clear with normal prosody and enunciation. Thought process is linear. Mood is normal and affect is normal.  Cranial nerves II - XII are as described above under HEENT exam.  Motor exam: Normal bulk, strength and tone is noted. There is no tremor, Romberg is negative. Fine motor skills and coordination: grossly intact.  Cerebellar testing: No dysmetria or intention tremor. There is no truncal or gait ataxia.  Sensory exam: intact to light touch in the upper and lower extremities.  Gait, station and balance: He stands easily. No veering to one side is noted. No leaning to one side is noted. Posture is age-appropriate and stance is narrow based. Gait shows normal stride length and normal pace. No problems turning are noted.   Assessment and Plan:  In summary, Dylan Obrien is a very pleasant 55 y.o.-year old male with an underlying medical history of allergic rhinitis, atypical chest pain, gout, status post surgery for left wrist  fracture in 2017, and obesity, whose history and physical exam are concerning for obstructive sleep apnea (OSA). I had a long chat with the patient about my findings and the diagnosis of OSA, its prognosis and treatment options. We talked about medical treatments, surgical interventions and non-pharmacological approaches. I explained in particular the risks and ramifications of untreated moderate to severe OSA, especially with respect to developing cardiovascular disease down the Road, including congestive heart failure, difficult to treat hypertension, cardiac arrhythmias, or stroke. Even type 2 diabetes has, in part, been linked to untreated OSA. Symptoms of untreated OSA include daytime sleepiness, memory problems, mood irritability and mood disorder such as depression and anxiety, lack of energy, as well as recurrent headaches, especially morning headaches. We talked about trying to maintain a healthy lifestyle in general, as well as the importance of weight control. We also talked about the importance of good sleep hygiene. I recommended the following at this time: sleep study.  I outlined the difference between a laboratory attended sleep study versus home sleep test.  I explained the sleep test procedure to the patient and also outlined possible surgical and non-surgical treatment options of OSA, including the use of a custom-made dental device (which would require a referral to a specialist dentist or oral surgeon), upper airway surgical options, such as traditional UPPP or a novel less invasive surgical option in the form of Inspire hypoglossal nerve stimulation (which would involve a referral to an ENT surgeon). I also explained the CPAP treatment option to the patient, who indicated that he would be willing to try CPAP if the need arises.  We will pick up our discussion after testing.  We will keep him posted as to his test results by phone call as well.  We will plan a follow-up in this clinic  accordingly.  I answered all his questions today and he was in agreement.   Thank you very much for allowing me to participate in the care of this nice patient. If I can be of any further assistance to you please do not hesitate to call me at [phone].  Sincerely,   Huston Foley, MD,  PhD

## 2021-04-25 ENCOUNTER — Telehealth: Payer: Self-pay

## 2021-04-25 NOTE — Telephone Encounter (Signed)
LVM for pt to call me back to schedule sleep study  

## 2021-04-30 ENCOUNTER — Telehealth: Payer: Self-pay

## 2021-04-30 ENCOUNTER — Other Ambulatory Visit: Payer: Self-pay

## 2021-04-30 ENCOUNTER — Ambulatory Visit (INDEPENDENT_AMBULATORY_CARE_PROVIDER_SITE_OTHER): Payer: 59 | Admitting: Physician Assistant

## 2021-04-30 DIAGNOSIS — M170 Bilateral primary osteoarthritis of knee: Secondary | ICD-10-CM

## 2021-04-30 MED ORDER — LIDOCAINE HCL 1 % IJ SOLN
1.5000 mL | INTRAMUSCULAR | Status: AC | PRN
  Administered 2021-04-30: 1.5 mL via INTRA_ARTICULAR

## 2021-04-30 MED ORDER — SODIUM HYALURONATE (VISCOSUP) 20 MG/2ML IX SOSY
20.0000 mg | PREFILLED_SYRINGE | INTRA_ARTICULAR | Status: AC | PRN
  Administered 2021-04-30: 20 mg via INTRA_ARTICULAR

## 2021-04-30 NOTE — Progress Notes (Signed)
   Procedure Note  Patient: Dylan Obrien             Date of Birth: 1966-05-19           MRN: [id number]             Visit Date: 04/30/2021  Procedures: Visit Diagnoses:  1. Primary osteoarthritis of both knees    Euflexxa #1 bilateral knees, B/B Large Joint Inj: bilateral knee on 04/30/2021 2:35 PM Indications: pain Details: 27 G 1.5 in needle, medial approach  Arthrogram: No  Medications (Right): 1.5 mL lidocaine 1 %; 20 mg Sodium Hyaluronate 20 MG/2ML Aspirate (Right): 0 mL Medications (Left): 1.5 mL lidocaine 1 %; 20 mg Sodium Hyaluronate 20 MG/2ML Aspirate (Left): 0 mL Outcome: tolerated well, no immediate complications Procedure, treatment alternatives, risks and benefits explained, specific risks discussed. Consent was given by the patient. Immediately prior to procedure a time out was called to verify the correct patient, procedure, equipment, support staff and site/side marked as required. Patient was prepped and draped in the usual sterile fashion.    Patient tolerated the procedure well.  Aftercare was discussed.  Sherron Ales, PA-C

## 2021-04-30 NOTE — Telephone Encounter (Signed)
LVM for pt to call me back to schedule sleep study  

## 2021-05-08 ENCOUNTER — Other Ambulatory Visit: Payer: Self-pay

## 2021-05-08 ENCOUNTER — Ambulatory Visit (INDEPENDENT_AMBULATORY_CARE_PROVIDER_SITE_OTHER): Payer: 59 | Admitting: Physician Assistant

## 2021-05-08 DIAGNOSIS — M170 Bilateral primary osteoarthritis of knee: Secondary | ICD-10-CM

## 2021-05-08 MED ORDER — SODIUM HYALURONATE (VISCOSUP) 20 MG/2ML IX SOSY
20.0000 mg | PREFILLED_SYRINGE | INTRA_ARTICULAR | Status: AC | PRN
  Administered 2021-05-08: 20 mg via INTRA_ARTICULAR

## 2021-05-08 MED ORDER — LIDOCAINE HCL 1 % IJ SOLN
1.5000 mL | INTRAMUSCULAR | Status: AC | PRN
  Administered 2021-05-08: 1.5 mL

## 2021-05-08 NOTE — Progress Notes (Signed)
   Procedure Note  Patient: Dylan Obrien             Date of Birth: 1965-11-07           MRN: [id number]             Visit Date: 05/08/2021  Procedures: Visit Diagnoses:  1. Primary osteoarthritis of both knees    Euflexxa #2 Bilateral knee joint injections-B/B Large Joint Inj: bilateral knee on 05/08/2021 9:51 AM Indications: pain Details: 27 G 1.5 in needle, medial approach  Arthrogram: No  Medications (Right): 1.5 mL lidocaine 1 %; 20 mg Sodium Hyaluronate 20 MG/2ML Aspirate (Right): 0 mL Medications (Left): 1.5 mL lidocaine 1 %; 20 mg Sodium Hyaluronate 20 MG/2ML Aspirate (Left): 0 mL Outcome: tolerated well, no immediate complications Procedure, treatment alternatives, risks and benefits explained, specific risks discussed. Consent was given by the patient. Immediately prior to procedure a time out was called to verify the correct patient, procedure, equipment, support staff and site/side marked as required. Patient was prepped and draped in the usual sterile fashion.    Patient tolerated the procedures well.  Aftercare was discussed.  Sherron Ales, PA-C

## 2021-05-09 ENCOUNTER — Ambulatory Visit (INDEPENDENT_AMBULATORY_CARE_PROVIDER_SITE_OTHER): Payer: 59 | Admitting: Neurology

## 2021-05-09 ENCOUNTER — Institutional Professional Consult (permissible substitution): Payer: 59 | Admitting: Neurology

## 2021-05-09 DIAGNOSIS — G4733 Obstructive sleep apnea (adult) (pediatric): Secondary | ICD-10-CM

## 2021-05-09 DIAGNOSIS — R351 Nocturia: Secondary | ICD-10-CM

## 2021-05-09 DIAGNOSIS — E669 Obesity, unspecified: Secondary | ICD-10-CM

## 2021-05-09 DIAGNOSIS — R0683 Snoring: Secondary | ICD-10-CM

## 2021-05-09 DIAGNOSIS — R519 Headache, unspecified: Secondary | ICD-10-CM

## 2021-05-09 DIAGNOSIS — R7989 Other specified abnormal findings of blood chemistry: Secondary | ICD-10-CM

## 2021-05-09 DIAGNOSIS — R635 Abnormal weight gain: Secondary | ICD-10-CM

## 2021-05-09 DIAGNOSIS — G4719 Other hypersomnia: Secondary | ICD-10-CM

## 2021-05-10 NOTE — Progress Notes (Signed)
See procedure note.

## 2021-05-14 ENCOUNTER — Ambulatory Visit (INDEPENDENT_AMBULATORY_CARE_PROVIDER_SITE_OTHER): Payer: 59 | Admitting: Physician Assistant

## 2021-05-14 ENCOUNTER — Other Ambulatory Visit: Payer: Self-pay

## 2021-05-14 DIAGNOSIS — M170 Bilateral primary osteoarthritis of knee: Secondary | ICD-10-CM | POA: Diagnosis not present

## 2021-05-14 MED ORDER — SODIUM HYALURONATE (VISCOSUP) 20 MG/2ML IX SOSY
20.0000 mg | PREFILLED_SYRINGE | INTRA_ARTICULAR | Status: AC | PRN
Start: 2021-05-14 — End: 2021-05-14
  Administered 2021-05-14: 20 mg via INTRA_ARTICULAR

## 2021-05-14 MED ORDER — LIDOCAINE HCL 1 % IJ SOLN
1.5000 mL | INTRAMUSCULAR | Status: AC | PRN
Start: 2021-05-14 — End: 2021-05-14
  Administered 2021-05-14: 1.5 mL via INTRA_ARTICULAR

## 2021-05-14 NOTE — Progress Notes (Signed)
   Procedure Note  Patient: Dylan Obrien             Date of Birth: 06-02-1966           MRN: [id number]             Visit Date: 05/14/2021  Procedures: Visit Diagnoses:  1. Primary osteoarthritis of both knees    Euflexxa #3 bilateral knees, B/B Large Joint Inj: bilateral knee on 05/14/2021 11:48 AM Indications: pain Details: 27 G 1.5 in needle, medial approach  Arthrogram: No  Medications (Right): 1.5 mL lidocaine 1 %; 20 mg Sodium Hyaluronate 20 MG/2ML Aspirate (Right): 0 mL Medications (Left): 1.5 mL lidocaine 1 %; 20 mg Sodium Hyaluronate 20 MG/2ML Aspirate (Left): 0 mL Outcome: tolerated well, no immediate complications Procedure, treatment alternatives, risks and benefits explained, specific risks discussed. Consent was given by the patient. Immediately prior to procedure a time out was called to verify the correct patient, procedure, equipment, support staff and site/side marked as required. Patient was prepped and draped in the usual sterile fashion.    Patient tolerated the procedure well.  Aftercare was discussed.  Sherron Ales, PA-C

## 2021-05-16 NOTE — Procedures (Signed)
     [facility]  HOME SLEEP TEST (Watch PAT) REPORT  STUDY DATE: 05/09/2021  DOB: 09-18-1965  MRN: [id number]  ORDERING CLINICIAN: Huston Foley, MD, PhD   REFERRING CLINICIAN: Jarome Matin, MD   CLINICAL INFORMATION/HISTORY: 55 year old right-handed gentleman with an underlying medical history of allergic rhinitis, atypical chest pain, gout, status post surgery for left wrist fracture in 2017, and obesity, who reports snoring and excessive daytime somnolence.   Epworth sleepiness score: 9/24.  BMI: 39.8 kg/m  FINDINGS:   Sleep Summary:   Total Recording Time (hours, min): 8 hours, 48 minutes  Total Sleep Time (hours, min):  7 hours, 11 minutes   Percent REM (%):    25.5%   Respiratory Indices:   Calculated pAHI (per hour):  19.2/hour         REM pAHI:    30.9/hour       NREM pAHI: 15.2/hour  Oxygen Saturation Statistics:    Oxygen Saturation (%) Mean: 95%   Minimum oxygen saturation (%):                 84%   O2 Saturation Range (%): 84-99%    O2 Saturation (minutes) <=88%: 0.9 min  Pulse Rate Statistics:   Pulse Mean (bpm):    47/min    Pulse Range (35-96/min)   IMPRESSION: OSA (obstructive sleep apnea)  RECOMMENDATION:  This home sleep test demonstrates moderate obstructive sleep apnea with a total AHI of 19.2/hour and O2 nadir of 84%.  Moderate to loud snoring was detected, at times in the mild range.  Treatment with positive airway pressure is recommended. The patient will be advised to proceed with an autoPAP titration/trial at home for now. A full night titration study may be considered to optimize treatment settings, if needed down the road. Please note that untreated obstructive sleep apnea may carry additional perioperative morbidity. Patients with significant obstructive sleep apnea should receive perioperative PAP therapy and the surgeons and particularly the anesthesiologist should be informed of the diagnosis and the  severity of the sleep disordered breathing.  Alternative treatment options may include dental treatment with an oral appliance or surgical treatment options.  Concomitant weight loss is highly recommended. The patient should be cautioned not to drive, work at heights, or operate dangerous or heavy equipment when tired or sleepy. Review and reiteration of good sleep hygiene measures should be pursued with any patient. Other causes of the patient's symptoms, including circadian rhythm disturbances, an underlying mood disorder, medication effect and/or an underlying medical problem cannot be ruled out based on this test. Clinical correlation is recommended. The patient and his referring provider will be notified of the test results. The patient will be seen in follow up in sleep clinic at [facility]  I certify that I have reviewed the raw data recording prior to the issuance of this report in accordance with the standards of the American Academy of Sleep Medicine (AASM).  INTERPRETING PHYSICIAN:   Huston Foley, MD, PhD  Board Certified in Neurology and Sleep Medicine  [facility] [address] [place], [place] [address] [phone]

## 2021-05-16 NOTE — Addendum Note (Signed)
Addended by: Huston Foley on: 05/16/2021 05:15 PM   Modules accepted: Orders

## 2021-05-17 ENCOUNTER — Telehealth: Payer: Self-pay | Admitting: *Deleted

## 2021-05-17 ENCOUNTER — Encounter: Payer: Self-pay | Admitting: Neurology

## 2021-05-17 NOTE — Telephone Encounter (Signed)
-----   Message from Huston Foley, MD sent at 05/16/2021  5:15 PM EST ----- Patient referred by Dr. Eloise Harman, seen by me on 04/18/2021, patient had a HST on 05/09/2021.    Please call and notify the patient that the recent home sleep test showed obstructive sleep apnea in the moderate range. I recommend treatment in the form of autoPAP, which means, that we don't have to bring him in for a sleep study with CPAP, but will let him start using a so called autoPAP machine at home, which is a CPAP-like machine with self-adjusting pressures. We will send the order to a local DME company (of his choice, or as per insurance requirement). The DME representative will fit him with a mask, educate him on how to use the machine, how to put the mask on, etc. I have placed an order in the chart. Please send the order, talk to patient, send report to referring MD. We will need a FU in sleep clinic for 10 weeks post-PAP set up, please arrange that with me or one of our NPs. Also reinforce the need for compliance with treatment. Thanks,   Huston Foley, MD, PhD [facility])

## 2021-05-17 NOTE — Telephone Encounter (Signed)
I called patient.  Relayed the home sleep test moderate OSA.  Recommending AutoPap.  He had a lot of questions relating to moderate OSA, and how that would affect him if treated or not treated with AutoPap machines themselves.  I informed not treating OSA can cause heart , lung problem, stroke , headaches.  He was not able to make an appointment to go over sleep test results at this time but will call tomorrow between 8 and 12 to make an appointment to do so.  I relayed that DME would go over machine, how to use, supplies, etc.  He verbalized understanding.  No soon appt at this time but cancellation would move up appt for him when he calls.

## 2021-06-21 ENCOUNTER — Encounter: Payer: Self-pay | Admitting: Neurology

## 2021-07-03 NOTE — Telephone Encounter (Signed)
Ross Ludwig, RN got it!      Previous Messages   ----- Message -----  From: Guy Begin, RN  Sent: 06/25/2021   9:45 AM EST  To: Dimas Millin, Jari Favre, *  Subject: new machine (from 05-09-2021)                   [facility]  Phone Number: [phone]   i have yet to hear from anyone about my equipment that was recommended after my sleep study on 11/2   i had called 2 weeks ago for the same issue and still no contact   time is if essence due to my insurance   please check on status   thank you   [phone]   Can you check on this please.  Delmer Islam

## 2021-07-13 ENCOUNTER — Other Ambulatory Visit: Payer: Self-pay | Admitting: Rheumatology

## 2021-07-26 ENCOUNTER — Other Ambulatory Visit: Payer: Self-pay | Admitting: Rheumatology

## 2021-08-02 ENCOUNTER — Other Ambulatory Visit: Payer: Self-pay | Admitting: Rheumatology

## 2021-08-21 ENCOUNTER — Telehealth: Payer: Self-pay

## 2021-08-21 DIAGNOSIS — M1A09X0 Idiopathic chronic gout, multiple sites, without tophus (tophi): Secondary | ICD-10-CM

## 2021-08-21 DIAGNOSIS — Z5181 Encounter for therapeutic drug level monitoring: Secondary | ICD-10-CM

## 2021-08-21 NOTE — Telephone Encounter (Signed)
Patient advised we would be unable to refill his prescription until he has updated labs. Patient advised the last labs we have are from 2021. Patient advised of lab hours and states he will get in here as soon as he can.

## 2021-08-21 NOTE — Telephone Encounter (Signed)
Patient called requesting prescription refill of Allopurinol to be sent to [facility] on [address].

## 2021-09-27 NOTE — Progress Notes (Signed)
? ?Office Visit Note ? ?Patient: Dylan Obrien             ?Date of Birth: [birth year]           ?MRN: [id number]             ?PCP: Dylan Lopes, MD ?Referring: Dylan Lopes, MD ?Visit Date: 10/11/2021 ?Occupation: @GUAROCC @ ? ?Subjective:  ?Medication monitoring ? ?History of Present Illness: Dylan Obrien is a 56 y.o. male with history of gout and osteoarthritis.  Patient has been off of allopurinol for the past 2 months due to running out of his prescription.  He denies any signs or symptoms of a gout flare while off of therapy.  He would like a new prescription of allopurinol to be sent to the pharmacy.  He states in the past when restarting allopurinol after gaps in therapy he has had gout flares.  He has a prescription for colchicine at home but has not taken it recently.  Patient reports that he plans on trying to work on weight loss and has considered going to a weight management clinic but is concerned about having a gout flare with certain diets.   ?Patient reports that since his last office visit he has started to use a CPAP at night.  His energy level throughout the day he started to improve.  He states that his knee joint pain has been stable since having Visco gel injections performed in October/November 2022.  He denies any knee joint swelling at this time.  He has had less difficulty climbing steps.  He has not ready to reapply for Visco gel injections at this time. ? ? ? ?Activities of Daily Living:  ?Patient reports morning stiffness for 0 minutes.   ?Patient Denies nocturnal pain.  ?Difficulty dressing/grooming: Denies ?Difficulty climbing stairs: Reports ?Difficulty getting out of chair: Denies ?Difficulty using hands for taps, buttons, cutlery, and/or writing: Denies ? ?Review of Systems  ?Constitutional:  Negative for fatigue.  ?HENT:  Negative for mouth sores, mouth dryness and nose dryness.   ?Eyes:  Negative for pain, itching and dryness.  ?Respiratory:  Negative for  shortness of breath and difficulty breathing.   ?Cardiovascular:  Negative for chest pain and palpitations.  ?Gastrointestinal:  Negative for blood in stool, constipation and diarrhea.  ?Endocrine: Negative for increased urination.  ?Genitourinary:  Negative for difficulty urinating.  ?Musculoskeletal:  Positive for joint pain, joint pain and joint swelling. Negative for myalgias, morning stiffness, muscle tenderness and myalgias.  ?Skin:  Negative for color change, rash and redness.  ?Allergic/Immunologic: Negative for susceptible to infections.  ?Neurological:  Negative for dizziness, numbness, headaches, memory loss and weakness.  ?Hematological:  Negative for bruising/bleeding tendency.  ?Psychiatric/Behavioral:  Negative for confusion.   ? ?PMFS History:  ?Patient Active Problem List  ? Diagnosis Date Noted  ? Primary osteoarthritis of both knees 10/14/2016  ? Primary osteoarthritis of both hands 10/14/2016  ? Gout   ? Hx of cardiovascular stress test   ? Chest pain 04/23/2012  ?  ?Past Medical History:  ?Diagnosis Date  ? Allergy   ? Arthritis   ? Chest pain 04/23/2012  ? Gout   ? Hx of cardiovascular stress test   ? a. ETT 11/13 with borderline ST changes => b. ETT-Myoview:  87mm ST depression V3-6 on ECG (false +), images with no ischemia, EF 58%  ? Sleep apnea   ?  ?Family History  ?Problem Relation Age of Onset  ? Breast cancer Mother   ?  Anemia Father   ? Cancer Sister   ?     brain  ? Colon cancer Neg Hx   ? Esophageal cancer Neg Hx   ? Rectal cancer Neg Hx   ? Stomach cancer Neg Hx   ? ?Past Surgical History:  ?Procedure Laterality Date  ? EXTERNAL FIXATION ARM  07/09/1987  ? ORIF WRIST FRACTURE Left   ? ROTATOR CUFF REPAIR Right 12/2020  ? ?Social History  ? ?Social History Narrative  ? Caffeine: 4 cups daily.   Education: Art gallery manager, work: Film/video editor.   ? ?Immunization History  ?Administered Date(s) Administered  ? Unspecified SARS-COV-2 Vaccination 10/07/2019, 10/28/2019  ?   ? ?Objective: ?Vital Signs: BP 130/83 (BP Location: Right Arm, Patient Position: Sitting, Cuff Size: Normal)   Pulse (!) 55   Ht 6\' 2"  (1.88 m)   Wt 271 lb 12.8 oz (123.3 kg)   BMI 34.90 kg/m?   ? ?Physical Exam ?Vitals and nursing note reviewed.  ?Constitutional:   ?   Appearance: He is well-developed.  ?HENT:  ?   Head: Normocephalic and atraumatic.  ?Eyes:  ?   Conjunctiva/sclera: Conjunctivae normal.  ?   Pupils: Pupils are equal, round, and reactive to light.  ?Cardiovascular:  ?   Rate and Rhythm: Normal rate and regular rhythm.  ?   Heart sounds: Normal heart sounds.  ?Pulmonary:  ?   Effort: Pulmonary effort is normal.  ?   Breath sounds: Normal breath sounds.  ?Abdominal:  ?   General: Bowel sounds are normal.  ?   Palpations: Abdomen is soft.  ?Musculoskeletal:  ?   Cervical back: Normal range of motion and neck supple.  ?Skin: ?   General: Skin is warm and dry.  ?   Capillary Refill: Capillary refill takes less than 2 seconds.  ?Neurological:  ?   Mental Status: He is alert and oriented to person, place, and time.  ?Psychiatric:     ?   Behavior: Behavior normal.  ?  ? ?Musculoskeletal Exam: C-spine, thoracic spine, lumbar spine have good range of motion.  No midline spinal tenderness or SI joint tenderness.  Shoulder joints, elbow joints, wrist joints, MCPs, PIPs, DIPs have good range of motion with no synovitis.  Complete fist formation bilaterally.  PIP and DIP thickening consistent with osteoarthritis of both hands.  Hip joints have good range of motion with no groin pain.  Knee joints have good range of motion with no warmth or effusion.  Ankle joints have good range of motion with no tenderness or joint swelling. ? ?CDAI Exam: ?CDAI Score: -- ?Patient Global: --; Provider Global: -- ?Swollen: --; Tender: -- ?Joint Exam 10/11/2021  ? ?No joint exam has been documented for this visit  ? ?There is currently no information documented on the homunculus. Go to the Rheumatology activity and complete  the homunculus joint exam. ? ?Investigation: ?No additional findings. ? ?Imaging: ?No results found. ? ?Recent Labs: ?Lab Results  ?Component Value Date  ? WBC 6.2 10/10/2021  ? HGB 14.5 10/10/2021  ? PLT 219 10/10/2021  ? NA 139 10/10/2021  ? K 4.1 10/10/2021  ? CL 103 10/10/2021  ? CO2 30 10/10/2021  ? GLUCOSE 80 10/10/2021  ? BUN 21 10/10/2021  ? CREATININE 1.38 (H) 10/10/2021  ? BILITOT 0.8 10/10/2021  ? ALKPHOS 40 08/09/2019  ? AST 17 10/10/2021  ? ALT 21 10/10/2021  ? PROT 7.1 10/10/2021  ? ALBUMIN 3.3 (L) 08/09/2019  ? CALCIUM 9.6 10/10/2021  ?  GFRAA 79 06/06/2020  ? ? ?Speciality Comments: No specialty comments available. ? ?Procedures:  ?No procedures performed ?Allergies: Eggs or egg-derived products  ? ?Assessment / Plan:     ?Visit Diagnoses: Idiopathic chronic gout of multiple sites without tophus - He has not had any signs or symptoms of a gout flare.  He has been off of allopurinol for the past 2 months due to requiring a refill.  He was previously tolerating allopurinol without any side effects or gout flares.  In the past when he has had gaps in therapy and restarted allopurinol he developed symptoms of a gout flare.  His uric acid was 8.9 on 10/10/2021.  Discussed that ideally his uric acid level should be less than 6 to prevent gout flares.  He was advised to take colchicine 0.6 mg 1 tablet daily for 3 to 4 days prior to resuming allopurinol as prescribed to prevent a flare.  If he develops signs or symptoms of a flare he can take colchicine 0.6 mg 1 tablet twice daily as needed.  ?Discussed the importance of avoiding a purine rich diet.  He was given an informational handout about dietary modifications to make.  He is planning on working on weight loss so he was given a handout of information about the healthy weight and wellness program through [facility]. ?He will follow-up in the office in 6 months or sooner if needed. ? ?Medication monitoring encounter: CBC, CMP, and uric acid were drawn yesterday on  10/10/2021.  Results were reviewed with the patient today in the office. ?Uric acid was 8.9 which is above the desirable range but he has been off of allopurinol for the past 2 months.  He will be restarting allopurinol 300

## 2021-10-10 ENCOUNTER — Other Ambulatory Visit: Payer: Self-pay | Admitting: *Deleted

## 2021-10-10 DIAGNOSIS — M1A09X0 Idiopathic chronic gout, multiple sites, without tophus (tophi): Secondary | ICD-10-CM

## 2021-10-10 DIAGNOSIS — Z5181 Encounter for therapeutic drug level monitoring: Secondary | ICD-10-CM

## 2021-10-10 LAB — CBC WITH DIFFERENTIAL/PLATELET
Absolute Monocytes: 589 cells/uL (ref 200–950)
Basophils Absolute: 31 cells/uL (ref 0–200)
Basophils Relative: 0.5 %
Eosinophils Absolute: 149 cells/uL (ref 15–500)
Eosinophils Relative: 2.4 %
HCT: 42.4 % (ref 38.5–50.0)
Hemoglobin: 14.5 g/dL (ref 13.2–17.1)
Lymphs Abs: 1451 cells/uL (ref 850–3900)
MCH: 31.3 pg (ref 27.0–33.0)
MCHC: 34.2 g/dL (ref 32.0–36.0)
MCV: 91.4 fL (ref 80.0–100.0)
MPV: 10.6 fL (ref 7.5–12.5)
Monocytes Relative: 9.5 %
Neutro Abs: 3980 cells/uL (ref 1500–7800)
Neutrophils Relative %: 64.2 %
Platelets: 219 10*3/uL (ref 140–400)
RBC: 4.64 10*6/uL (ref 4.20–5.80)
RDW: 13 % (ref 11.0–15.0)
Total Lymphocyte: 23.4 %
WBC: 6.2 10*3/uL (ref 3.8–10.8)

## 2021-10-10 LAB — COMPLETE METABOLIC PANEL WITH GFR
AG Ratio: 1.4 (calc) (ref 1.0–2.5)
ALT: 21 U/L (ref 9–46)
AST: 17 U/L (ref 10–35)
Albumin: 4.2 g/dL (ref 3.6–5.1)
Alkaline phosphatase (APISO): 48 U/L (ref 35–144)
BUN/Creatinine Ratio: 15 (calc) (ref 6–22)
BUN: 21 mg/dL (ref 7–25)
CO2: 30 mmol/L (ref 20–32)
Calcium: 9.6 mg/dL (ref 8.6–10.3)
Chloride: 103 mmol/L (ref 98–110)
Creat: 1.38 mg/dL — ABNORMAL HIGH (ref 0.70–1.30)
Globulin: 2.9 g/dL (calc) (ref 1.9–3.7)
Glucose, Bld: 80 mg/dL (ref 65–99)
Potassium: 4.1 mmol/L (ref 3.5–5.3)
Sodium: 139 mmol/L (ref 135–146)
Total Bilirubin: 0.8 mg/dL (ref 0.2–1.2)
Total Protein: 7.1 g/dL (ref 6.1–8.1)
eGFR: 60 mL/min/{1.73_m2} (ref >=60)

## 2021-10-10 LAB — URIC ACID: Uric Acid, Serum: 8.9 mg/dL — ABNORMAL HIGH (ref 4.0–8.0)

## 2021-10-11 ENCOUNTER — Encounter: Payer: Self-pay | Admitting: Physician Assistant

## 2021-10-11 ENCOUNTER — Ambulatory Visit (INDEPENDENT_AMBULATORY_CARE_PROVIDER_SITE_OTHER): Payer: 59 | Admitting: Physician Assistant

## 2021-10-11 VITALS — BP 130/83 | HR 55 | Ht 74.0 in | Wt 271.8 lb

## 2021-10-11 DIAGNOSIS — M170 Bilateral primary osteoarthritis of knee: Secondary | ICD-10-CM

## 2021-10-11 DIAGNOSIS — M19041 Primary osteoarthritis, right hand: Secondary | ICD-10-CM

## 2021-10-11 DIAGNOSIS — Z5181 Encounter for therapeutic drug level monitoring: Secondary | ICD-10-CM | POA: Diagnosis not present

## 2021-10-11 DIAGNOSIS — M25561 Pain in right knee: Secondary | ICD-10-CM

## 2021-10-11 DIAGNOSIS — G8929 Other chronic pain: Secondary | ICD-10-CM

## 2021-10-11 DIAGNOSIS — M25562 Pain in left knee: Secondary | ICD-10-CM

## 2021-10-11 DIAGNOSIS — M19042 Primary osteoarthritis, left hand: Secondary | ICD-10-CM

## 2021-10-11 DIAGNOSIS — M1A09X0 Idiopathic chronic gout, multiple sites, without tophus (tophi): Secondary | ICD-10-CM | POA: Diagnosis not present

## 2021-10-11 MED ORDER — ALLOPURINOL 300 MG PO TABS: 300.0000 mg | ORAL_TABLET | Freq: Every day | ORAL | 0 refills | Status: DC

## 2021-10-11 NOTE — Patient Instructions (Signed)

## 2021-12-20 IMAGING — DX DG CHEST 1V PORT
2 series · 2 of 2 positions shown · non-contrast
Comparison: None.

CLINICAL DATA: Cough and fever.

EXAM:
PORTABLE CHEST 1 VIEW

[chest ap (1 of 2)]
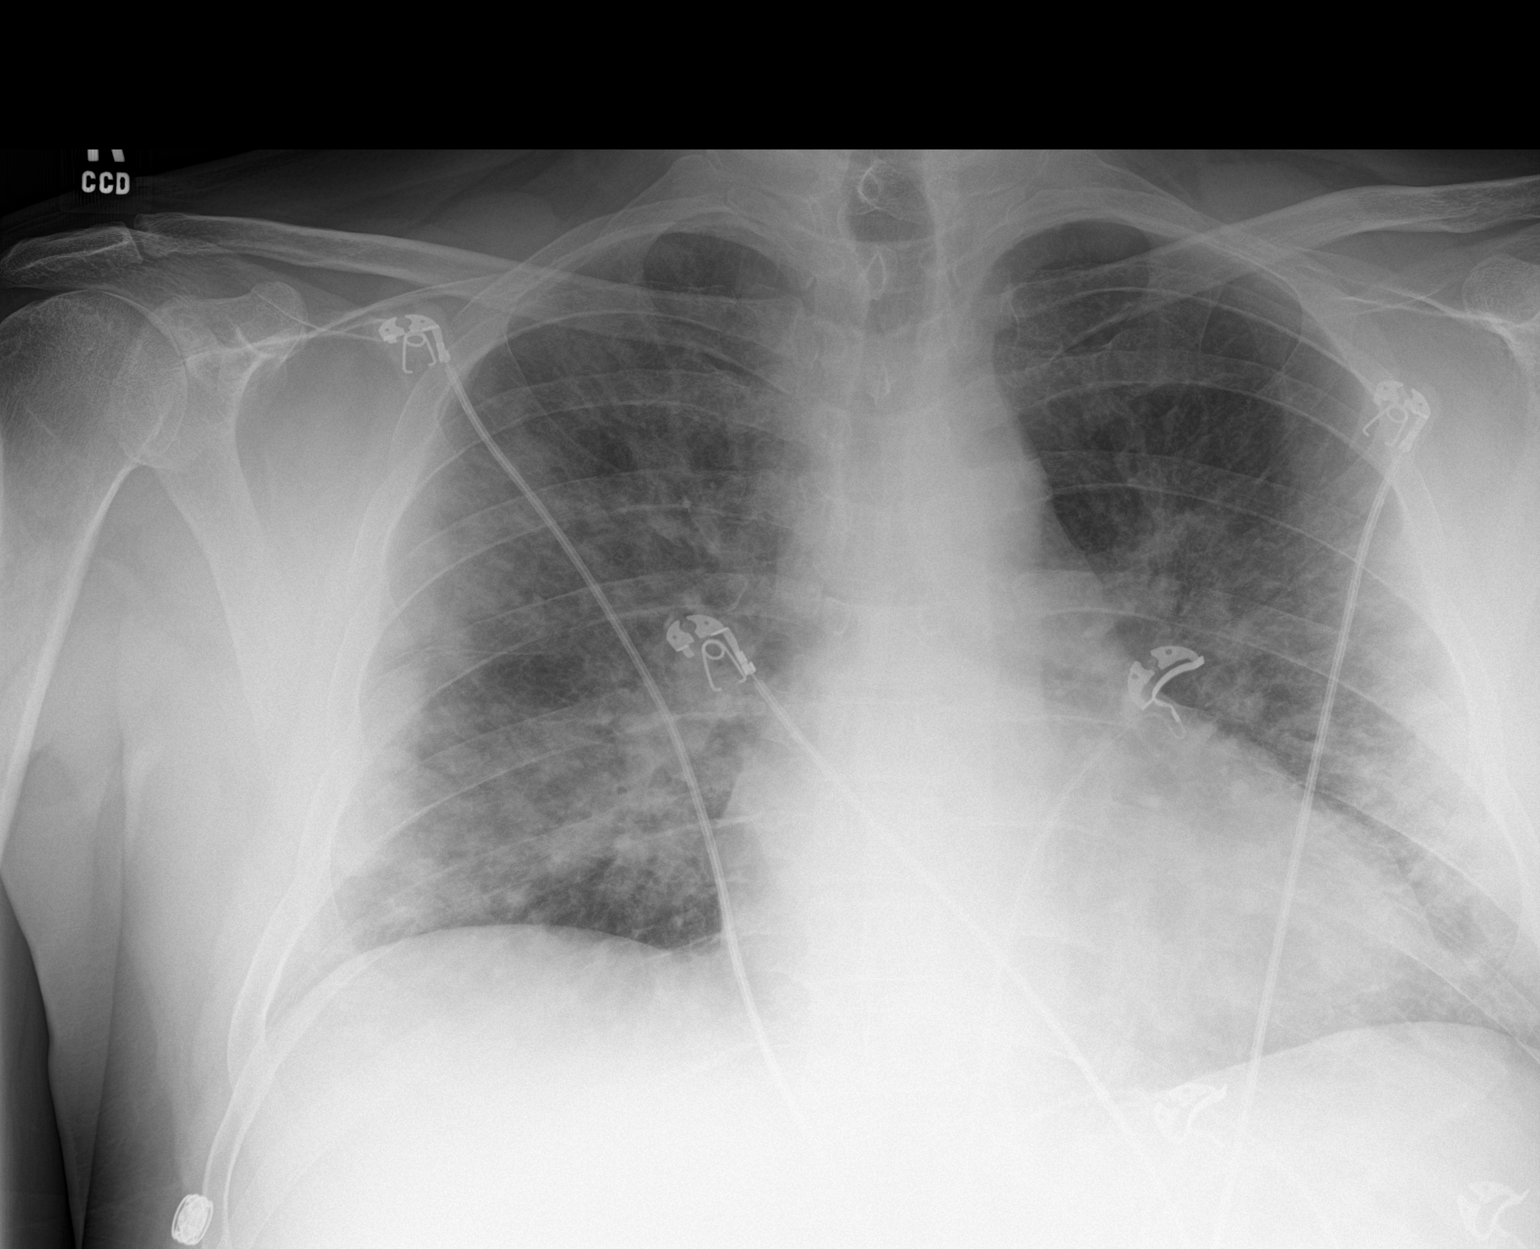

[chest ap (2 of 2)]
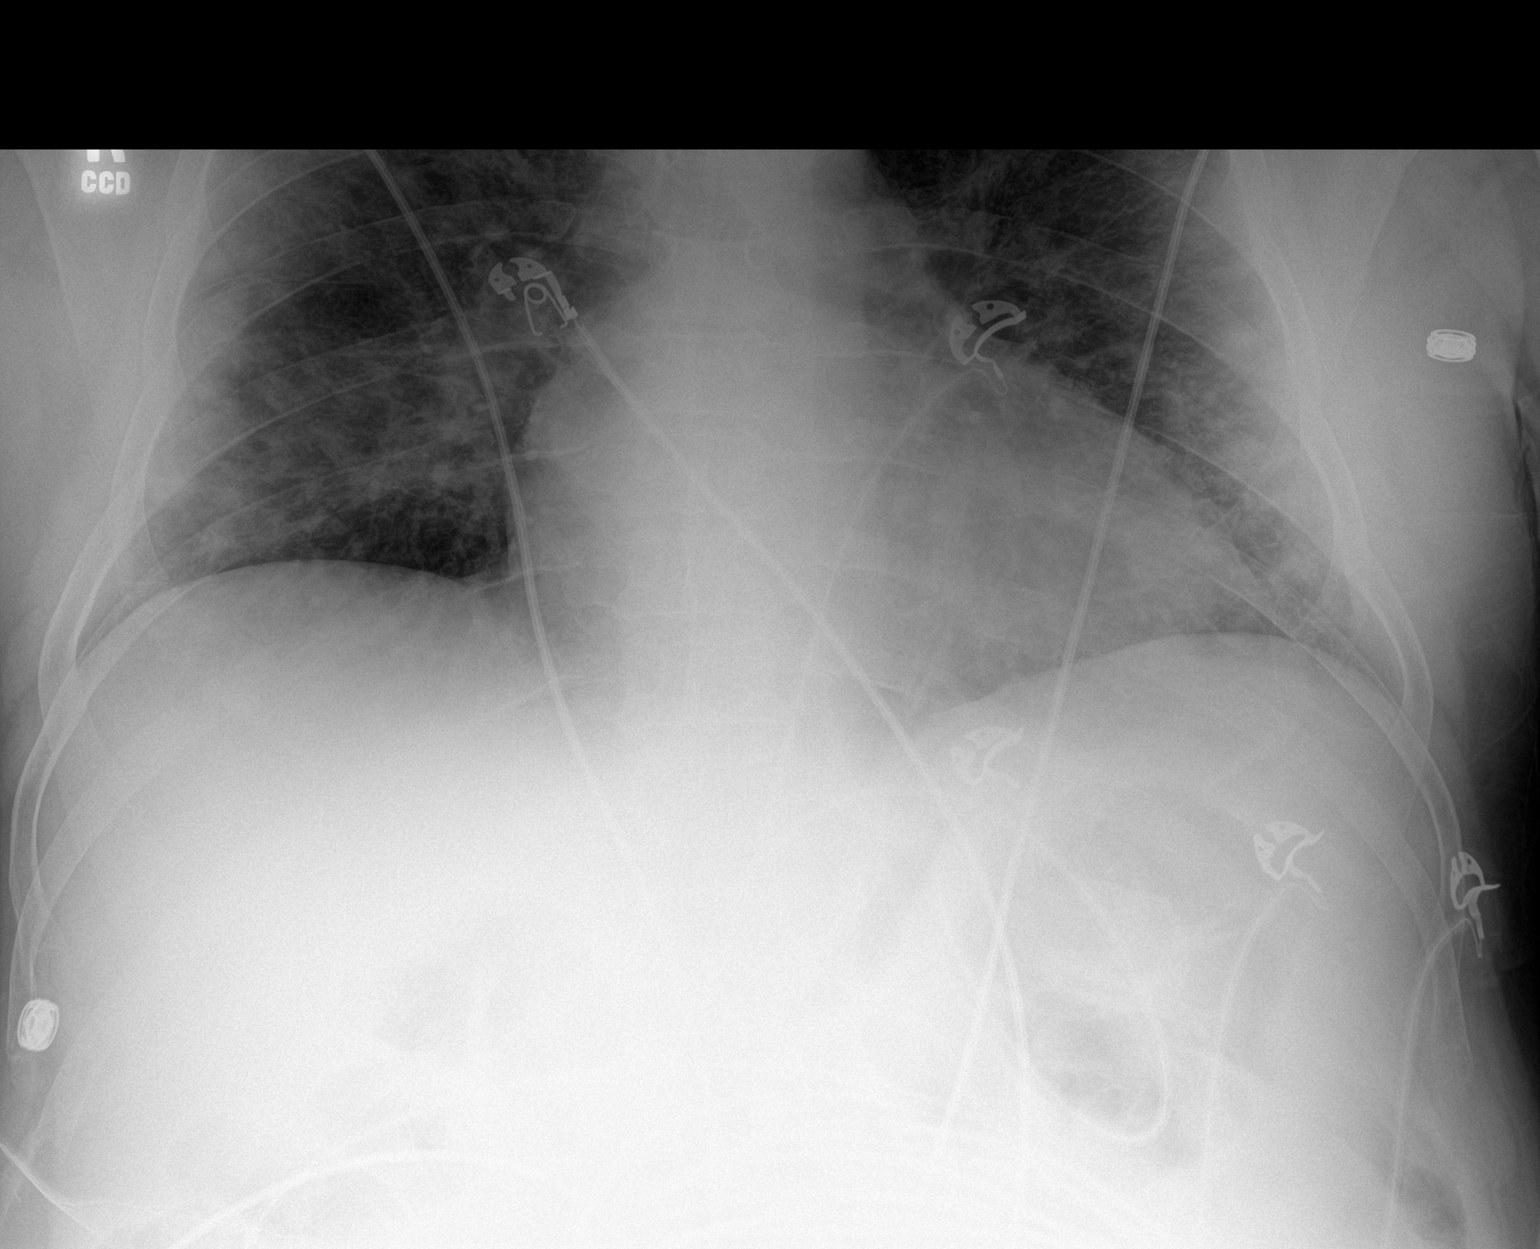

[2 of 2 positions shown; findings below may reference images not displayed]

FINDINGS: The cardiac silhouette, mediastinal and hilar contours are within
normal limits given the AP projection and portable technique.

Vague ill-defined patchy interstitial and airspace infiltrates in
both lungs suspicious for atypical pneumonia such as COVID
pneumonia.

No pleural effusion or pulmonary lesions.

The bony thorax is intact.
IMPRESSION: Patchy bilateral ill-defined infiltrates suspicious for COVID
pneumonia.

## 2022-01-18 ENCOUNTER — Other Ambulatory Visit: Payer: Self-pay | Admitting: Physician Assistant

## 2022-01-21 NOTE — Telephone Encounter (Signed)
Next Visit: 04/18/2022  Last Visit: 10/11/2021  Last Fill: 10/11/2021  DX: Idiopathic chronic gout of multiple sites without tophus   Current Dose per office note 10/11/2021: allopurinol 300 mg 1 tablet daily as prescribed.   Labs: 10/10/2021 Creat. 1.38, Uric Acid 8.9  Okay to refill Allopurinol?

## 2022-04-04 NOTE — Progress Notes (Deleted)
Office Visit Note  Patient: Dylan Obrien             Date of Birth: 1965-11-15           MRN: [id number]             PCP: Donnajean Lopes, MD Referring: Donnajean Lopes, MD Visit Date: 04/18/2022 Occupation: @GUAROCC @  Subjective:  No chief complaint on file.   History of Present Illness: Dylan Obrien is a 56 y.o. male ***   Activities of Daily Living:  Patient reports morning stiffness for *** {minute/hour:19697}.   Patient {ACTIONS;DENIES/REPORTS:21021675::"Denies"} nocturnal pain.  Difficulty dressing/grooming: {ACTIONS;DENIES/REPORTS:21021675::"Denies"} Difficulty climbing stairs: {ACTIONS;DENIES/REPORTS:21021675::"Denies"} Difficulty getting out of chair: {ACTIONS;DENIES/REPORTS:21021675::"Denies"} Difficulty using hands for taps, buttons, cutlery, and/or writing: {ACTIONS;DENIES/REPORTS:21021675::"Denies"}  No Rheumatology ROS completed.   PMFS History:  Patient Active Problem List   Diagnosis Date Noted  . Primary osteoarthritis of both knees 10/14/2016  . Primary osteoarthritis of both hands 10/14/2016  . Gout   . Hx of cardiovascular stress test   . Chest pain 04/23/2012    Past Medical History:  Diagnosis Date  . Allergy   . Arthritis   . Chest pain 04/23/2012  . Gout   . Hx of cardiovascular stress test    a. ETT 11/13 with borderline ST changes => b. ETT-Myoview:  41mm ST depression V3-6 on ECG (false +), images with no ischemia, EF 58%  . Sleep apnea     Family History  Problem Relation Age of Onset  . Breast cancer Mother   . Anemia Father   . Cancer Sister        brain  . Colon cancer Neg Hx   . Esophageal cancer Neg Hx   . Rectal cancer Neg Hx   . Stomach cancer Neg Hx    Past Surgical History:  Procedure Laterality Date  . EXTERNAL FIXATION ARM  07/09/1987  . ORIF WRIST FRACTURE Left   . ROTATOR CUFF REPAIR Right 12/2020   Social History   Social History Narrative   Caffeine: 4 cups daily.   Education: Oceanographer, work: Film/video editor.    Immunization History  Administered Date(s) Administered  . Unspecified SARS-COV-2 Vaccination 10/07/2019, 10/28/2019     Objective: Vital Signs: There were no vitals taken for this visit.   Physical Exam   Musculoskeletal Exam: ***  CDAI Exam: CDAI Score: -- Patient Global: --; Provider Global: -- Swollen: --; Tender: -- Joint Exam 04/18/2022   No joint exam has been documented for this visit   There is currently no information documented on the homunculus. Go to the Rheumatology activity and complete the homunculus joint exam.  Investigation: No additional findings.  Imaging: No results found.  Recent Labs: Lab Results  Component Value Date   WBC 6.2 10/10/2021   HGB 14.5 10/10/2021   PLT 219 10/10/2021   NA 139 10/10/2021   K 4.1 10/10/2021   CL 103 10/10/2021   CO2 30 10/10/2021   GLUCOSE 80 10/10/2021   BUN 21 10/10/2021   CREATININE 1.38 (H) 10/10/2021   BILITOT 0.8 10/10/2021   ALKPHOS 40 08/09/2019   AST 17 10/10/2021   ALT 21 10/10/2021   PROT 7.1 10/10/2021   ALBUMIN 3.3 (L) 08/09/2019   CALCIUM 9.6 10/10/2021   GFRAA 79 06/06/2020    Speciality Comments: No specialty comments available.  Procedures:  No procedures performed Allergies: Eggs or egg-derived products   Assessment / Plan:     Visit Diagnoses: No diagnosis found.  Orders: No orders of the defined types were placed in this encounter.  No orders of the defined types were placed in this encounter.   Face-to-face time spent with patient was *** minutes. Greater than 50% of time was spent in counseling and coordination of care.  Follow-Up Instructions: No follow-ups on file.   Earnestine Mealing, CMA  Note - This record has been created using Editor, commissioning.  Chart creation errors have been sought, but may not always  have been located. Such creation errors do not reflect on  the standard of medical care.

## 2022-04-16 NOTE — Progress Notes (Signed)
Office Visit Note  Patient: Dylan Obrien             Date of Birth: July 09, 1965           MRN: [id number]             PCP: Donnajean Lopes, MD Referring: Donnajean Lopes, MD Visit Date: 04/30/2022 Occupation: @GUAROCC @  Subjective:  Pain in both knees  History of Present Illness: Anival Pasha is a 56 y.o. male with history of osteoarthritis and gout.  He states he has been taking allopurinol 400 mg p.o. daily.  He has not had a gout flare since the last visit.  He states he had some intentional weight loss.  He has been having increased pain and discomfort in his bilateral knee joints.  He had good response to Visco supplement injections in the past but they are not covered by his insurance.  He complains of nocturnal pain in his knees and also difficulty climbing stairs and getting up from the chair.  He has been taking turmeric for pain relief.  He takes Advil and Tylenol occasionally.  Activities of Daily Living:  Patient reports morning stiffness for 0 minutes.   Patient Reports nocturnal pain.  Difficulty dressing/grooming: Reports Difficulty climbing stairs: Reports Difficulty getting out of chair: Denies Difficulty using hands for taps, buttons, cutlery, and/or writing: Denies  Review of Systems  Constitutional:  Negative for fatigue.  HENT:  Negative for mouth sores and mouth dryness.   Eyes:  Negative for dryness.  Respiratory:  Negative for difficulty breathing.   Cardiovascular:  Negative for chest pain and palpitations.  Gastrointestinal:  Negative for blood in stool, constipation and diarrhea.  Endocrine: Negative for increased urination.  Genitourinary:  Negative for involuntary urination.  Musculoskeletal:  Positive for joint pain, joint pain, myalgias and myalgias. Negative for gait problem, joint swelling, muscle weakness, morning stiffness and muscle tenderness.  Skin:  Negative for color change, rash and sensitivity to sunlight.   Allergic/Immunologic: Negative for susceptible to infections.  Neurological:  Negative for dizziness and headaches.  Hematological:  Negative for swollen glands.  Psychiatric/Behavioral:  Negative for depressed mood and sleep disturbance. The patient is not nervous/anxious.     PMFS History:  Patient Active Problem List   Diagnosis Date Noted   Primary osteoarthritis of both knees 10/14/2016   Primary osteoarthritis of both hands 10/14/2016   Gout    Hx of cardiovascular stress test    Chest pain 04/23/2012    Past Medical History:  Diagnosis Date   Allergy    Arthritis    Chest pain 04/23/2012   Gout    Hx of cardiovascular stress test    a. ETT 11/13 with borderline ST changes => b. ETT-Myoview:  66mm ST depression V3-6 on ECG (false +), images with no ischemia, EF 58%   Sleep apnea     Family History  Problem Relation Age of Onset   Breast cancer Mother    Anemia Father    Cancer Sister        brain   Colon cancer Neg Hx    Esophageal cancer Neg Hx    Rectal cancer Neg Hx    Stomach cancer Neg Hx    Past Surgical History:  Procedure Laterality Date   EXTERNAL FIXATION ARM  07/09/1987   ORIF WRIST FRACTURE Left    ROTATOR CUFF REPAIR Right 12/2020   Social History   Social History Narrative   Caffeine: 4 cups daily.  Education: Medical laboratory scientific officer, work: Doctor, hospital.    Immunization History  Administered Date(s) Administered   Unspecified SARS-COV-2 Vaccination 10/07/2019, 10/28/2019     Objective: Vital Signs: BP 139/87 (BP Location: Left Arm, Patient Position: Sitting, Cuff Size: Normal)   Pulse (!) 53   Resp 18   Ht 6\' 2"  (1.88 m)   Wt 267 lb 9.6 oz (121.4 kg)   BMI 34.36 kg/m    Physical Exam Vitals and nursing note reviewed.  Constitutional:      Appearance: He is well-developed.  HENT:     Head: Normocephalic and atraumatic.  Eyes:     Conjunctiva/sclera: Conjunctivae normal.     Pupils: Pupils are equal, round, and reactive to  light.  Cardiovascular:     Rate and Rhythm: Normal rate and regular rhythm.     Heart sounds: Normal heart sounds.  Pulmonary:     Effort: Pulmonary effort is normal.     Breath sounds: Normal breath sounds.  Abdominal:     General: Bowel sounds are normal.     Palpations: Abdomen is soft.  Musculoskeletal:     Cervical back: Normal range of motion and neck supple.  Skin:    General: Skin is warm and dry.     Capillary Refill: Capillary refill takes less than 2 seconds.  Neurological:     Mental Status: He is alert and oriented to person, place, and time.  Psychiatric:        Behavior: Behavior normal.      Musculoskeletal Exam: Cervical, thoracic and lumbar spine were in good range of motion.  Shoulder joints, elbow joints, wrist joints, MCPs PIPs and DIPs been good range of motion with no synovitis.  Hip joints and knee joints with good range of motion.  No warmth swelling or effusion was noted.  There was no tenderness over ankles or MTPs.  CDAI Exam: CDAI Score: -- Patient Global: --; Provider Global: -- Swollen: --; Tender: -- Joint Exam 04/30/2022   No joint exam has been documented for this visit   There is currently no information documented on the homunculus. Go to the Rheumatology activity and complete the homunculus joint exam.  Investigation: No additional findings.  Imaging: No results found.  Recent Labs: Lab Results  Component Value Date   WBC 6.6 04/29/2022   HGB 14.3 04/29/2022   PLT 230 04/29/2022   NA 141 04/29/2022   K 4.3 04/29/2022   CL 104 04/29/2022   CO2 26 04/29/2022   GLUCOSE 88 04/29/2022   BUN 26 (H) 04/29/2022   CREATININE 1.04 04/29/2022   BILITOT 0.6 04/29/2022   ALKPHOS 40 08/09/2019   AST 19 04/29/2022   ALT 17 04/29/2022   PROT 7.0 04/29/2022   ALBUMIN 3.3 (L) 08/09/2019   CALCIUM 9.3 04/29/2022   GFRAA 79 06/06/2020    Speciality Comments: No specialty comments available.  Procedures:  Large Joint Inj: bilateral  knee on 04/30/2022 11:56 AM Indications: pain Details: 27 G 1.5 in needle, medial approach  Arthrogram: No  Medications (Right): 1.5 mL lidocaine 1 %; 40 mg triamcinolone acetonide 40 MG/ML Medications (Left): 1.5 mL lidocaine 1 %; 40 mg triamcinolone acetonide 40 MG/ML Outcome: tolerated well, no immediate complications Procedure, treatment alternatives, risks and benefits explained, specific risks discussed. Consent was given by the patient. Immediately prior to procedure a time out was called to verify the correct patient, procedure, equipment, support staff and site/side marked as required. Patient was prepped and draped in the usual sterile  fashion.     Allergies: Eggs or egg-derived products   Assessment / Plan:     Visit Diagnoses: Primary osteoarthritis of both knees -he has been experiencing increased pain and discomfort in his bilateral knee joints.  He complains of nocturnal pain, difficulty walking and getting up from the sitting position.  He has been swimming for exercise.  He states he had some intentional weight loss.  No warmth swelling or effusion was noted.  Previous x-rays showed moderate osteoarthritis in the right knee joint and moderate to severe osteoarthritis in the left knee joint.  -Per patient's request bilateral knee joints were injected with lidocaine and Kenalog.  He tolerated the procedure well.  He had good response to Visco supplement injections in the past but it is not covered by his insurance.  I will obtain x-rays today.  Plan: XR KNEE 3 VIEW RIGHT, XR KNEE 3 VIEW LEFT.  Radiographic progression was noted when compared to the x-rays of 2021.  Right knee joint showed moderate osteoarthritis and moderate chondromalacia patella.  Left knee joint showed severe osteoarthritis and moderate chondromalacia patella.  X-ray findings were discussed with the patient.  We will apply for viscosupplement injections again as he had good response to Visco supplement injections in  the past.  A handout on lower extremity exercises was placed in the AVS.  Weight loss diet and exercise was emphasized.  This patient is diagnosed with osteoarthritis of the knee(s).    Radiographs show evidence of joint space narrowing, osteophytes, subchondral sclerosis and/or subchondral cysts.  This patient has knee pain which interferes with functional and activities of daily living.    This patient has experienced inadequate response, adverse effects and/or intolerance with conservative treatments such as acetaminophen, NSAIDS, topical creams, physical therapy or regular exercise, knee bracing and/or weight loss.   This patient has experienced inadequate response or has a contraindication to intra articular steroid injections for at least 3 months.   This patient is not scheduled to have a total knee replacement within 6 months of starting treatment with viscosupplementation.   Primary osteoarthritis of both hands-he had some DIP and PIP thickening but no synovitis was noted.  Idiopathic chronic gout of multiple sites without tophus - allopurinol 400mg  by mouth daily and colchicine 0.6mg  by mouth daily as needed. uric acid: 8.9 on 10/10/2021.  Uric acid was ordered and the results are pending.  Medication monitoring encounter-Labs obtained on April 29, 2022 CBC and CMP were normal.  Lab results were discussed with the patient. - Orders: Orders Placed This Encounter  Procedures   Large Joint Inj   XR KNEE 3 VIEW RIGHT   XR KNEE 3 VIEW LEFT   No orders of the defined types were placed in this encounter.    Follow-Up Instructions: Return for Osteoarthritis.   May 01, 2022, MD  Note - This record has been created using Pollyann Savoy.  Chart creation errors have been sought, but may not always  have been located. Such creation errors do not reflect on  the standard of medical care.

## 2022-04-18 ENCOUNTER — Ambulatory Visit: Payer: 59 | Admitting: Rheumatology

## 2022-04-18 DIAGNOSIS — M19041 Primary osteoarthritis, right hand: Secondary | ICD-10-CM

## 2022-04-18 DIAGNOSIS — Z5181 Encounter for therapeutic drug level monitoring: Secondary | ICD-10-CM

## 2022-04-18 DIAGNOSIS — M170 Bilateral primary osteoarthritis of knee: Secondary | ICD-10-CM

## 2022-04-18 DIAGNOSIS — M1A09X0 Idiopathic chronic gout, multiple sites, without tophus (tophi): Secondary | ICD-10-CM

## 2022-04-29 ENCOUNTER — Other Ambulatory Visit: Payer: Self-pay

## 2022-04-29 DIAGNOSIS — Z5181 Encounter for therapeutic drug level monitoring: Secondary | ICD-10-CM

## 2022-04-29 DIAGNOSIS — M1A09X0 Idiopathic chronic gout, multiple sites, without tophus (tophi): Secondary | ICD-10-CM

## 2022-04-30 ENCOUNTER — Ambulatory Visit (INDEPENDENT_AMBULATORY_CARE_PROVIDER_SITE_OTHER): Payer: 59

## 2022-04-30 ENCOUNTER — Other Ambulatory Visit: Payer: Self-pay | Admitting: *Deleted

## 2022-04-30 ENCOUNTER — Encounter: Payer: Self-pay | Admitting: Rheumatology

## 2022-04-30 ENCOUNTER — Ambulatory Visit: Payer: 59 | Attending: Rheumatology | Admitting: Rheumatology

## 2022-04-30 ENCOUNTER — Telehealth: Payer: Self-pay

## 2022-04-30 VITALS — BP 139/87 | HR 53 | Resp 18 | Ht 74.0 in | Wt 267.6 lb

## 2022-04-30 DIAGNOSIS — M1A09X0 Idiopathic chronic gout, multiple sites, without tophus (tophi): Secondary | ICD-10-CM

## 2022-04-30 DIAGNOSIS — M19041 Primary osteoarthritis, right hand: Secondary | ICD-10-CM

## 2022-04-30 DIAGNOSIS — M1712 Unilateral primary osteoarthritis, left knee: Secondary | ICD-10-CM | POA: Diagnosis not present

## 2022-04-30 DIAGNOSIS — Z5181 Encounter for therapeutic drug level monitoring: Secondary | ICD-10-CM | POA: Diagnosis not present

## 2022-04-30 DIAGNOSIS — M170 Bilateral primary osteoarthritis of knee: Secondary | ICD-10-CM

## 2022-04-30 DIAGNOSIS — M1711 Unilateral primary osteoarthritis, right knee: Secondary | ICD-10-CM

## 2022-04-30 DIAGNOSIS — M19042 Primary osteoarthritis, left hand: Secondary | ICD-10-CM

## 2022-04-30 LAB — URINALYSIS, ROUTINE W REFLEX MICROSCOPIC
Bilirubin Urine: NEGATIVE
Glucose, UA: NEGATIVE
Hgb urine dipstick: NEGATIVE
Ketones, ur: NEGATIVE
Leukocytes,Ua: NEGATIVE
Nitrite: NEGATIVE
Protein, ur: NEGATIVE
Specific Gravity, Urine: 1.024 (ref 1.001–1.035)
pH: 5.5 (ref 5.0–8.0)

## 2022-04-30 LAB — CBC WITH DIFFERENTIAL/PLATELET
Absolute Monocytes: 581 cells/uL (ref 200–950)
Basophils Absolute: 40 cells/uL (ref 0–200)
Basophils Relative: 0.6 %
Eosinophils Absolute: 310 cells/uL (ref 15–500)
Eosinophils Relative: 4.7 %
HCT: 40.4 % (ref 38.5–50.0)
Hemoglobin: 14.3 g/dL (ref 13.2–17.1)
Lymphs Abs: 1584 cells/uL (ref 850–3900)
MCH: 32.4 pg (ref 27.0–33.0)
MCHC: 35.4 g/dL (ref 32.0–36.0)
MCV: 91.6 fL (ref 80.0–100.0)
MPV: 10.4 fL (ref 7.5–12.5)
Monocytes Relative: 8.8 %
Neutro Abs: 4085 cells/uL (ref 1500–7800)
Neutrophils Relative %: 61.9 %
Platelets: 230 10*3/uL (ref 140–400)
RBC: 4.41 10*6/uL (ref 4.20–5.80)
RDW: 12.9 % (ref 11.0–15.0)
Total Lymphocyte: 24 %
WBC: 6.6 10*3/uL (ref 3.8–10.8)

## 2022-04-30 LAB — COMPLETE METABOLIC PANEL WITH GFR
AG Ratio: 1.7 (calc) (ref 1.0–2.5)
ALT: 17 U/L (ref 9–46)
AST: 19 U/L (ref 10–35)
Albumin: 4.4 g/dL (ref 3.6–5.1)
Alkaline phosphatase (APISO): 50 U/L (ref 35–144)
BUN/Creatinine Ratio: 25 (calc) — ABNORMAL HIGH (ref 6–22)
BUN: 26 mg/dL — ABNORMAL HIGH (ref 7–25)
CO2: 26 mmol/L (ref 20–32)
Calcium: 9.3 mg/dL (ref 8.6–10.3)
Chloride: 104 mmol/L (ref 98–110)
Creat: 1.04 mg/dL (ref 0.70–1.30)
Globulin: 2.6 g/dL (calc) (ref 1.9–3.7)
Glucose, Bld: 88 mg/dL (ref 65–99)
Potassium: 4.3 mmol/L (ref 3.5–5.3)
Sodium: 141 mmol/L (ref 135–146)
Total Bilirubin: 0.6 mg/dL (ref 0.2–1.2)
Total Protein: 7 g/dL (ref 6.1–8.1)
eGFR: 84 mL/min/{1.73_m2} (ref >=60)

## 2022-04-30 LAB — TEST AUTHORIZATION

## 2022-04-30 LAB — URIC ACID: Uric Acid, Serum: 5.9 mg/dL (ref 4.0–8.0)

## 2022-04-30 MED ORDER — TRIAMCINOLONE ACETONIDE 40 MG/ML IJ SUSP
40.0000 mg | INTRAMUSCULAR | Status: AC | PRN
  Administered 2022-04-30: 40 mg via INTRA_ARTICULAR

## 2022-04-30 MED ORDER — LIDOCAINE HCL 1 % IJ SOLN
1.5000 mL | INTRAMUSCULAR | Status: AC | PRN
  Administered 2022-04-30: 1.5 mL

## 2022-04-30 NOTE — Progress Notes (Signed)
Uric acid was 5.9-within desirable range.

## 2022-04-30 NOTE — Progress Notes (Signed)
CBC WNL. UA normal.  CMP stable.

## 2022-04-30 NOTE — Telephone Encounter (Signed)
Please ask patient if he can take allopurinol 300 mg, 1-1/2 tablet daily.  We can ask the pharmacy to cut the tablets.

## 2022-04-30 NOTE — Telephone Encounter (Signed)
Please call to schedule visco injections.  Approved for Euflexxa, bilateral knee(s). Buy & Bill Covered at 70% of the allowable amount $50 co-pay Deductibles does apply and must be met before coverage applies. No pre-certifications

## 2022-04-30 NOTE — Telephone Encounter (Signed)
Please apply for bilateral knee visco, per Dr. Deveshwar. Thanks!  

## 2022-04-30 NOTE — Telephone Encounter (Signed)
Please clarify with the patient.  He told me that he is on allopurinol 100 mg tablets 4 tablets daily by his PCP.

## 2022-04-30 NOTE — Telephone Encounter (Signed)
Patient requests a refill of Allopurinol to [facility] [place] [facility].   Next Visit: 04/30/2023  Last Visit: 04/30/2022  Last Fill: 01/21/2022  DX: Idiopathic chronic gout of multiple sites without tophus   Current Dose per office note 04/30/2022: allopurinol 400mg  by mouth daily   Labs: 04/30/2022 CBC WNL. CMP stable. Uric acid was 5.9-within desirable range.    Okay to refill Allopurinol?

## 2022-04-30 NOTE — Telephone Encounter (Signed)
VOB submitted for Euflexxa, bilateral knees BV pending 

## 2022-04-30 NOTE — Patient Instructions (Signed)

## 2022-04-30 NOTE — Telephone Encounter (Signed)
Patient is agreeable to take allopurinol 300 mg, 1-1/2 tablet daily.  We can ask the pharmacy to cut the tablets.

## 2022-05-01 MED ORDER — ALLOPURINOL 300 MG PO TABS: 450.0000 mg | ORAL_TABLET | Freq: Every day | ORAL | 0 refills | Status: DC

## 2022-06-12 ENCOUNTER — Ambulatory Visit: Payer: 59 | Attending: Physician Assistant | Admitting: Physician Assistant

## 2022-06-12 DIAGNOSIS — M170 Bilateral primary osteoarthritis of knee: Secondary | ICD-10-CM

## 2022-06-12 MED ORDER — LIDOCAINE HCL 1 % IJ SOLN
1.5000 mL | INTRAMUSCULAR | Status: AC | PRN
  Administered 2022-06-12: 1.5 mL

## 2022-06-12 MED ORDER — SODIUM HYALURONATE (VISCOSUP) 20 MG/2ML IX SOSY
20.0000 mg | PREFILLED_SYRINGE | INTRA_ARTICULAR | Status: AC | PRN
  Administered 2022-06-12: 20 mg via INTRA_ARTICULAR

## 2022-06-12 NOTE — Progress Notes (Signed)
   Procedure Note  Patient: Dylan Obrien             Date of Birth: [birth year]           MRN: [id number]             Visit Date: 06/12/2022  Procedures: Visit Diagnoses:  1. Primary osteoarthritis of both knees    Euflexxa #1 Bilateral knee joint injections  Large Joint Inj: bilateral knee on 06/12/2022 2:35 PM Indications: pain Details: 27 G 1.5 in needle, medial approach  Arthrogram: No  Medications (Right): 1.5 mL lidocaine 1 %; 20 mg Sodium Hyaluronate (Viscosup) 20 MG/2ML Aspirate (Right): 0 mL Medications (Left): 1.5 mL lidocaine 1 %; 20 mg Sodium Hyaluronate (Viscosup) 20 MG/2ML Aspirate (Left): 0 mL Outcome: tolerated well, no immediate complications Procedure, treatment alternatives, risks and benefits explained, specific risks discussed. Consent was given by the patient. Immediately prior to procedure a time out was called to verify the correct patient, procedure, equipment, support staff and site/side marked as required. Patient was prepped and draped in the usual sterile fashion.     Patient tolerated the procedure well.  Aftercare was discussed.  Sherron Ales, PA-C

## 2022-06-19 ENCOUNTER — Ambulatory Visit: Payer: 59 | Attending: Physician Assistant | Admitting: Physician Assistant

## 2022-06-19 DIAGNOSIS — M25562 Pain in left knee: Secondary | ICD-10-CM

## 2022-06-19 DIAGNOSIS — M170 Bilateral primary osteoarthritis of knee: Secondary | ICD-10-CM

## 2022-06-19 DIAGNOSIS — M25561 Pain in right knee: Secondary | ICD-10-CM | POA: Diagnosis not present

## 2022-06-19 MED ORDER — SODIUM HYALURONATE (VISCOSUP) 20 MG/2ML IX SOSY
20.0000 mg | PREFILLED_SYRINGE | INTRA_ARTICULAR | Status: AC | PRN
  Administered 2022-06-19: 20 mg via INTRA_ARTICULAR

## 2022-06-19 MED ORDER — LIDOCAINE HCL 1 % IJ SOLN
1.5000 mL | INTRAMUSCULAR | Status: AC | PRN
  Administered 2022-06-19: 1.5 mL

## 2022-06-19 NOTE — Progress Notes (Signed)
   Procedure Note  Patient: Dylan Obrien             Date of Birth: 07-01-1966           MRN: [id number]             Visit Date: 06/19/2022  Procedures: Visit Diagnoses:  1. Primary osteoarthritis of both knees    Euflexxa #2 bilateral knees, B/B Large Joint Inj: bilateral knee on 06/19/2022 2:50 PM Indications: pain Details: 27 G 1.5 in needle, medial approach  Arthrogram: No  Medications (Right): 1.5 mL lidocaine 1 %; 20 mg Sodium Hyaluronate (Viscosup) 20 MG/2ML Aspirate (Right): 0 mL Medications (Left): 1.5 mL lidocaine 1 %; 20 mg Sodium Hyaluronate (Viscosup) 20 MG/2ML Aspirate (Left): 0 mL Outcome: tolerated well, no immediate complications Procedure, treatment alternatives, risks and benefits explained, specific risks discussed. Consent was given by the patient. Immediately prior to procedure a time out was called to verify the correct patient, procedure, equipment, support staff and site/side marked as required. Patient was prepped and draped in the usual sterile fashion.     Patient tolerated the procedure well.  Aftercare was discussed.  Sherron Ales, PA-C

## 2022-06-26 ENCOUNTER — Ambulatory Visit: Payer: 59 | Attending: Physician Assistant | Admitting: Physician Assistant

## 2022-06-26 DIAGNOSIS — M170 Bilateral primary osteoarthritis of knee: Secondary | ICD-10-CM

## 2022-06-26 MED ORDER — LIDOCAINE HCL 1 % IJ SOLN
1.5000 mL | INTRAMUSCULAR | Status: AC | PRN
  Administered 2022-06-26: 1.5 mL

## 2022-06-26 MED ORDER — SODIUM HYALURONATE (VISCOSUP) 20 MG/2ML IX SOSY
20.0000 mg | PREFILLED_SYRINGE | INTRA_ARTICULAR | Status: AC | PRN
  Administered 2022-06-26: 20 mg via INTRA_ARTICULAR

## 2022-06-26 NOTE — Progress Notes (Signed)
   Procedure Note  Patient: Dylan Obrien             Date of Birth: 22-Dec-1965           MRN: [id number]             Visit Date: 06/26/2022  Procedures: Visit Diagnoses:  1. Primary osteoarthritis of both knees    Euflexxa #3 bilateral knees, B/B Large Joint Inj: bilateral knee on 06/26/2022 3:14 PM Indications: pain Details: 27 G 1.5 in needle, medial approach  Arthrogram: No  Medications (Right): 1.5 mL lidocaine 1 %; 20 mg Sodium Hyaluronate (Viscosup) 20 MG/2ML Aspirate (Right): 0 mL Medications (Left): 1.5 mL lidocaine 1 %; 20 mg Sodium Hyaluronate (Viscosup) 20 MG/2ML Aspirate (Left): 0 mL Outcome: tolerated well, no immediate complications Procedure, treatment alternatives, risks and benefits explained, specific risks discussed. Consent was given by the patient. Immediately prior to procedure a time out was called to verify the correct patient, procedure, equipment, support staff and site/side marked as required. Patient was prepped and draped in the usual sterile fashion.     Patient tolerated the procedure well.  Aftercare was discussed.  Sherron Ales, PA-C

## 2022-07-29 ENCOUNTER — Other Ambulatory Visit: Payer: Self-pay | Admitting: Rheumatology

## 2022-07-29 NOTE — Telephone Encounter (Signed)
Next Visit: 04/30/2023   Last Visit: 04/30/2022   Last Fill: 05/01/2022   DX: Idiopathic chronic gout of multiple sites without tophus    Current Dose per phone note 04/30/2022: allopurinol 300 mg, 1-1/2 tablet daily.    Labs: 04/30/2022 CBC WNL. CMP stable. Uric acid was 5.9-within desirable range.     Okay to refill Allopurinol?

## 2022-11-06 ENCOUNTER — Other Ambulatory Visit: Payer: Self-pay | Admitting: Physician Assistant

## 2022-11-06 ENCOUNTER — Encounter: Payer: Self-pay | Admitting: *Deleted

## 2022-11-06 DIAGNOSIS — M1A09X0 Idiopathic chronic gout, multiple sites, without tophus (tophi): Secondary | ICD-10-CM

## 2022-11-06 NOTE — Telephone Encounter (Signed)
Last Fill: 07/29/2022  Labs: 04/29/2022 CBC WNL UA normal. CMP stable.  Next Visit: 04/30/2023  Last Visit: 04/30/2022  DX: Idiopathic chronic gout of multiple sites without tophus   Current Dose per office note 04/30/2022: allopurinol 400mg  by mouth daily   Message sent to patient to advise he is due to update labs.   Okay to refill Allopurinol?

## 2022-11-11 ENCOUNTER — Other Ambulatory Visit: Payer: Self-pay

## 2022-11-11 DIAGNOSIS — Z5181 Encounter for therapeutic drug level monitoring: Secondary | ICD-10-CM

## 2022-11-11 DIAGNOSIS — M1A09X0 Idiopathic chronic gout, multiple sites, without tophus (tophi): Secondary | ICD-10-CM

## 2022-11-12 LAB — COMPLETE METABOLIC PANEL WITH GFR
AG Ratio: 1.7 (calc) (ref 1.0–2.5)
ALT: 17 U/L (ref 9–46)
AST: 17 U/L (ref 10–35)
Albumin: 4.3 g/dL (ref 3.6–5.1)
Alkaline phosphatase (APISO): 44 U/L (ref 35–144)
BUN: 23 mg/dL (ref 7–25)
CO2: 30 mmol/L (ref 20–32)
Calcium: 9.1 mg/dL (ref 8.6–10.3)
Chloride: 107 mmol/L (ref 98–110)
Creat: 1.3 mg/dL (ref 0.70–1.30)
Globulin: 2.5 g/dL (calc) (ref 1.9–3.7)
Glucose, Bld: 85 mg/dL (ref 65–99)
Potassium: 3.9 mmol/L (ref 3.5–5.3)
Sodium: 142 mmol/L (ref 135–146)
Total Bilirubin: 0.6 mg/dL (ref 0.2–1.2)
Total Protein: 6.8 g/dL (ref 6.1–8.1)
eGFR: 64 mL/min/{1.73_m2} (ref >=60)

## 2022-11-12 LAB — CBC WITH DIFFERENTIAL/PLATELET
Absolute Monocytes: 525 cells/uL (ref 200–950)
Basophils Absolute: 30 cells/uL (ref 0–200)
Basophils Relative: 0.5 %
Eosinophils Absolute: 301 cells/uL (ref 15–500)
Eosinophils Relative: 5.1 %
HCT: 41.2 % (ref 38.5–50.0)
Hemoglobin: 14 g/dL (ref 13.2–17.1)
Lymphs Abs: 1398 cells/uL (ref 850–3900)
MCH: 31.5 pg (ref 27.0–33.0)
MCHC: 34 g/dL (ref 32.0–36.0)
MCV: 92.6 fL (ref 80.0–100.0)
MPV: 11.1 fL (ref 7.5–12.5)
Monocytes Relative: 8.9 %
Neutro Abs: 3646 cells/uL (ref 1500–7800)
Neutrophils Relative %: 61.8 %
Platelets: 223 10*3/uL (ref 140–400)
RBC: 4.45 10*6/uL (ref 4.20–5.80)
RDW: 13 % (ref 11.0–15.0)
Total Lymphocyte: 23.7 %
WBC: 5.9 10*3/uL (ref 3.8–10.8)

## 2022-11-12 LAB — URINALYSIS, ROUTINE W REFLEX MICROSCOPIC
Bilirubin Urine: NEGATIVE
Glucose, UA: NEGATIVE
Hgb urine dipstick: NEGATIVE
Ketones, ur: NEGATIVE
Leukocytes,Ua: NEGATIVE
Nitrite: NEGATIVE
Protein, ur: NEGATIVE
Specific Gravity, Urine: 1.024 (ref 1.001–1.035)
pH: 6 (ref 5.0–8.0)

## 2022-11-12 LAB — URIC ACID: Uric Acid, Serum: 5.4 mg/dL (ref 4.0–8.0)

## 2022-11-12 NOTE — Progress Notes (Signed)
CBC and CMP WNL. UA normal.

## 2022-11-12 NOTE — Progress Notes (Signed)
Uric acid is in the desirable range.  No change in treatment advised.

## 2022-11-20 ENCOUNTER — Other Ambulatory Visit: Payer: Self-pay | Admitting: Rheumatology

## 2022-11-21 NOTE — Telephone Encounter (Signed)
Last Fill: 11/06/2022 30 day supply   Labs: 11/11/2022 CBC and CMP WNL. UA normal. Uric acid is in the desirable range.  No change in treatment advised.   Next Visit: 04/30/2023  Last Visit: 04/30/2022  DX: Idiopathic chronic gout of multiple sites without tophus   Current Dose per phone note on 04/30/2022: allopurinol 300 mg, 1-1/2 tablet daily.   Okay to refill Allopurinol?

## 2023-03-06 ENCOUNTER — Other Ambulatory Visit: Payer: Self-pay | Admitting: Physician Assistant

## 2023-03-06 NOTE — Telephone Encounter (Signed)
Please clarify if he is taking 400 mg or 450 mg of allopurinol daily?

## 2023-03-06 NOTE — Telephone Encounter (Signed)
Last Fill: 11/21/2022  Labs: 11/11/2022 CBC and CMP WNL. Uric acid is in the desirable range.   Next Visit: 04/30/2023  Last Visit: 04/29/2022  DX: Idiopathic chronic gout of multiple sites without tophus   Current Dose per office note 04/30/2022: allopurinol 400mg  by mouth daily   Okay to refill Allopurinol?

## 2023-04-17 NOTE — Progress Notes (Signed)
Office Visit Note  Patient: Dylan Obrien             Date of Birth: 06-19-1966           MRN: [id number]             PCP: Garlan Fillers, MD Referring: Garlan Fillers, MD Visit Date: 04/30/2023 Occupation: @GUAROCC @  Subjective:  Hammertoes  History of Present Illness: Rhys Dejongh is a 57 y.o. male with osteoarthritis and gout.  He denies having a gout flare since the last visit.  He has been taking allopurinol 450 mg daily.  He did not have to take any colchicine.  He complains of discomfort in his bilateral feet.  He states he is getting hammertoes and it is difficult for him to find shoes.  His bunions also bother him.  He has not had much discomfort in his hands on his knee joints.  His last viscosupplement injections were in December 2023.  He was recently diagnosed with rosacea by his ophthalmologist.  He was placed on doxycycline which she started about 3 weeks ago.    Activities of Daily Living:  Patient reports morning stiffness for 0 minute.   Patient Denies nocturnal pain.  Difficulty dressing/grooming: Denies Difficulty climbing stairs: Denies Difficulty getting out of chair: Denies Difficulty using hands for taps, buttons, cutlery, and/or writing: Denies  Review of Systems  Constitutional:  Negative for fatigue.  HENT:  Negative for mouth sores and mouth dryness.   Eyes:  Negative for dryness.  Respiratory:  Negative for shortness of breath.   Cardiovascular:  Negative for chest pain and palpitations.  Gastrointestinal:  Negative for blood in stool, constipation and diarrhea.  Endocrine: Negative for increased urination.  Genitourinary:  Negative for involuntary urination.  Musculoskeletal:  Positive for joint pain and joint pain. Negative for gait problem, joint swelling, myalgias, muscle weakness, morning stiffness, muscle tenderness and myalgias.  Skin:  Negative for color change, rash, hair loss and sensitivity to sunlight.   Allergic/Immunologic: Negative for susceptible to infections.  Neurological:  Negative for dizziness and headaches.  Hematological:  Negative for swollen glands.  Psychiatric/Behavioral:  Negative for depressed mood and sleep disturbance. The patient is not nervous/anxious.     PMFS History:  Patient Active Problem List   Diagnosis Date Noted   Primary osteoarthritis of both knees 10/14/2016   Primary osteoarthritis of both hands 10/14/2016   Gout    Hx of cardiovascular stress test    Chest pain 04/23/2012    Past Medical History:  Diagnosis Date   Allergy    Arthritis    Chest pain 04/23/2012   Gout    Hx of cardiovascular stress test    a. ETT 11/13 with borderline ST changes => b. ETT-Myoview:  1mm ST depression V3-6 on ECG (false +), images with no ischemia, EF 58%   Sleep apnea     Family History  Problem Relation Age of Onset   Breast cancer Mother    Anemia Father    Cancer Sister        brain   Colon cancer Neg Hx    Esophageal cancer Neg Hx    Rectal cancer Neg Hx    Stomach cancer Neg Hx    Past Surgical History:  Procedure Laterality Date   EXTERNAL FIXATION ARM  07/09/1987   ORIF WRIST FRACTURE Left    ROTATOR CUFF REPAIR Right 12/2020   Social History   Social History Narrative   Caffeine: 4  cups daily.   Education: Medical laboratory scientific officer, work: Doctor, hospital.    Immunization History  Administered Date(s) Administered   Unspecified SARS-COV-2 Vaccination 10/07/2019, 10/28/2019     Objective: Vital Signs: BP 124/77 (BP Location: Left Arm, Patient Position: Sitting, Cuff Size: Normal)   Pulse (!) 42   Resp 14   Ht 6\' 2"  (1.88 m)   Wt 265 lb (120.2 kg)   BMI 34.02 kg/m    Physical Exam Vitals and nursing note reviewed.  Constitutional:      Appearance: He is well-developed.  HENT:     Head: Normocephalic and atraumatic.  Eyes:     Conjunctiva/sclera: Conjunctivae normal.     Pupils: Pupils are equal, round, and reactive to light.   Cardiovascular:     Rate and Rhythm: Normal rate and regular rhythm.     Heart sounds: Normal heart sounds.  Pulmonary:     Effort: Pulmonary effort is normal.     Breath sounds: Normal breath sounds.  Abdominal:     General: Bowel sounds are normal.     Palpations: Abdomen is soft.  Musculoskeletal:     Cervical back: Normal range of motion and neck supple.  Skin:    General: Skin is warm and dry.     Capillary Refill: Capillary refill takes less than 2 seconds.  Neurological:     Mental Status: He is alert and oriented to person, place, and time.  Psychiatric:        Behavior: Behavior normal.      Musculoskeletal Exam: Cervical, thoracic and lumbar spine were in good range of motion.  Shoulder joints, elbow joints, wrist joints, MCPs PIPs and DIPs Juengel range of motion with no synovitis.  Hip joints, knee joints, ankles, MTPs and PIPs with good range of motion.  He had bilateral first MTP thickening.  He had hammertoes bilaterally.  CDAI Exam: CDAI Score: -- Patient Global: --; Provider Global: -- Swollen: --; Tender: -- Joint Exam 04/30/2023   No joint exam has been documented for this visit   There is currently no information documented on the homunculus. Go to the Rheumatology activity and complete the homunculus joint exam.  Investigation: No additional findings.  Imaging: No results found.  Recent Labs: Lab Results  Component Value Date   WBC 6.8 04/29/2023   HGB 14.1 04/29/2023   PLT 216 04/29/2023   NA 140 04/29/2023   K 4.0 04/29/2023   CL 105 04/29/2023   CO2 27 04/29/2023   GLUCOSE 86 04/29/2023   BUN 31 (H) 04/29/2023   CREATININE 1.58 (H) 04/29/2023   BILITOT 0.6 04/29/2023   ALKPHOS 40 08/09/2019   AST 16 04/29/2023   ALT 18 04/29/2023   PROT 6.9 04/29/2023   ALBUMIN 3.3 (L) 08/09/2019   CALCIUM 9.4 04/29/2023   GFRAA 79 06/06/2020    Speciality Comments: No specialty comments available.  Procedures:  No procedures  performed Allergies: Egg-derived products   Assessment / Plan:     Visit Diagnoses: Idiopathic chronic gout of multiple sites without tophus - allopurinol 450mg  by mouth daily and colchicine 0.6mg  by mouth daily as needed.  Patient denies having a flare of gout since the last visit.  Labs obtained recently by his PCP showed uric acid of 6.0.  Medication monitoring encounter-patient had recent labs by Dr. Jarold Motto which showed CBC was normal CMP showed creatinine of 1.58.  Elevated serum creatinine-elevated creatinine was discussed with the patient.  Patient recently started taking doxycycline for rosacea.  He  will follow-up with Dr. Eloise Harman.  Primary osteoarthritis of both hands-he has been happy and DIP thickening.  No synovitis was noted.  Primary osteoarthritis of both knees - s/p euflexxa bilateral knees 06/2022.  He denies any increased discomfort in his knee joints.  Primary osteoarthritis of both feet-he has been having pain and discomfort in his bilateral feet.  He also has hammertoes.  He has bilateral bunions.  He may benefit from orthotics.  I will refer him to podiatry.  Rosacea-he had papular lesions on his face.  Patient states he was recently seen by an his ophthalmologist and was placed on doxycycline as rosacea was encroaching his eyes.  Orders: Orders Placed This Encounter  Procedures   Ambulatory referral to Podiatry   No orders of the defined types were placed in this encounter.    Follow-Up Instructions: Return in about 5 months (around 09/28/2023).   Pollyann Savoy, MD  Note - This record has been created using Animal nutritionist.  Chart creation errors have been sought, but may not always  have been located. Such creation errors do not reflect on  the standard of medical care.

## 2023-04-29 ENCOUNTER — Other Ambulatory Visit: Payer: Self-pay | Admitting: *Deleted

## 2023-04-29 DIAGNOSIS — Z5181 Encounter for therapeutic drug level monitoring: Secondary | ICD-10-CM

## 2023-04-29 DIAGNOSIS — M1A09X0 Idiopathic chronic gout, multiple sites, without tophus (tophi): Secondary | ICD-10-CM

## 2023-04-30 ENCOUNTER — Telehealth: Payer: Self-pay | Admitting: *Deleted

## 2023-04-30 ENCOUNTER — Encounter: Payer: Self-pay | Admitting: Rheumatology

## 2023-04-30 ENCOUNTER — Ambulatory Visit: Payer: 59 | Attending: Rheumatology | Admitting: Rheumatology

## 2023-04-30 VITALS — BP 124/77 | HR 42 | Resp 14 | Ht 74.0 in | Wt 265.0 lb

## 2023-04-30 DIAGNOSIS — R7989 Other specified abnormal findings of blood chemistry: Secondary | ICD-10-CM

## 2023-04-30 DIAGNOSIS — M2042 Other hammer toe(s) (acquired), left foot: Secondary | ICD-10-CM

## 2023-04-30 DIAGNOSIS — M19071 Primary osteoarthritis, right ankle and foot: Secondary | ICD-10-CM

## 2023-04-30 DIAGNOSIS — M19041 Primary osteoarthritis, right hand: Secondary | ICD-10-CM | POA: Diagnosis not present

## 2023-04-30 DIAGNOSIS — M19042 Primary osteoarthritis, left hand: Secondary | ICD-10-CM

## 2023-04-30 DIAGNOSIS — M1A09X0 Idiopathic chronic gout, multiple sites, without tophus (tophi): Secondary | ICD-10-CM | POA: Diagnosis not present

## 2023-04-30 DIAGNOSIS — Z5181 Encounter for therapeutic drug level monitoring: Secondary | ICD-10-CM

## 2023-04-30 DIAGNOSIS — L719 Rosacea, unspecified: Secondary | ICD-10-CM

## 2023-04-30 DIAGNOSIS — M2041 Other hammer toe(s) (acquired), right foot: Secondary | ICD-10-CM

## 2023-04-30 DIAGNOSIS — M170 Bilateral primary osteoarthritis of knee: Secondary | ICD-10-CM

## 2023-04-30 DIAGNOSIS — M19072 Primary osteoarthritis, left ankle and foot: Secondary | ICD-10-CM

## 2023-04-30 LAB — COMPLETE METABOLIC PANEL WITH GFR
AG Ratio: 1.6 (calc) (ref 1.0–2.5)
ALT: 18 U/L (ref 9–46)
AST: 16 U/L (ref 10–35)
Albumin: 4.2 g/dL (ref 3.6–5.1)
Alkaline phosphatase (APISO): 50 U/L (ref 35–144)
BUN/Creatinine Ratio: 20 (calc) (ref 6–22)
BUN: 31 mg/dL — ABNORMAL HIGH (ref 7–25)
CO2: 27 mmol/L (ref 20–32)
Calcium: 9.4 mg/dL (ref 8.6–10.3)
Chloride: 105 mmol/L (ref 98–110)
Creat: 1.58 mg/dL — ABNORMAL HIGH (ref 0.70–1.30)
Globulin: 2.7 g/dL (ref 1.9–3.7)
Glucose, Bld: 86 mg/dL (ref 65–99)
Potassium: 4 mmol/L (ref 3.5–5.3)
Sodium: 140 mmol/L (ref 135–146)
Total Bilirubin: 0.6 mg/dL (ref 0.2–1.2)
Total Protein: 6.9 g/dL (ref 6.1–8.1)
eGFR: 50 mL/min/{1.73_m2} — ABNORMAL LOW (ref >=60)

## 2023-04-30 LAB — CBC WITH DIFFERENTIAL/PLATELET
Absolute Lymphocytes: 1401 {cells}/uL (ref 850–3900)
Absolute Monocytes: 551 {cells}/uL (ref 200–950)
Basophils Absolute: 27 {cells}/uL (ref 0–200)
Basophils Relative: 0.4 %
Eosinophils Absolute: 252 {cells}/uL (ref 15–500)
Eosinophils Relative: 3.7 %
HCT: 42.4 % (ref 38.5–50.0)
Hemoglobin: 14.1 g/dL (ref 13.2–17.1)
MCH: 30.9 pg (ref 27.0–33.0)
MCHC: 33.3 g/dL (ref 32.0–36.0)
MCV: 93 fL (ref 80.0–100.0)
MPV: 11.2 fL (ref 7.5–12.5)
Monocytes Relative: 8.1 %
Neutro Abs: 4570 {cells}/uL (ref 1500–7800)
Neutrophils Relative %: 67.2 %
Platelets: 216 10*3/uL (ref 140–400)
RBC: 4.56 10*6/uL (ref 4.20–5.80)
RDW: 12.5 % (ref 11.0–15.0)
Total Lymphocyte: 20.6 %
WBC: 6.8 10*3/uL (ref 3.8–10.8)

## 2023-04-30 LAB — URINALYSIS, ROUTINE W REFLEX MICROSCOPIC
Bilirubin Urine: NEGATIVE
Glucose, UA: NEGATIVE
Hgb urine dipstick: NEGATIVE
Ketones, ur: NEGATIVE
Leukocytes,Ua: NEGATIVE
Nitrite: NEGATIVE
Protein, ur: NEGATIVE
Specific Gravity, Urine: 1.028 (ref 1.001–1.035)
pH: 6 (ref 5.0–8.0)

## 2023-04-30 LAB — URIC ACID: Uric Acid, Serum: 6 mg/dL (ref 4.0–8.0)

## 2023-04-30 NOTE — Telephone Encounter (Signed)
-----   Message from Gearldine Bienenstock sent at 04/30/2023  6:40 AM EDT ----- CBC WNL  UA normal.   Creatinine is elevated-1.58.  GFR is low-50. Please clarify if he is taking NSAIDs? Any other medication changes? Recommend rechecking BMP with GFR in 2 weeks.  Avoid all NSAID use and increase water intake. Rest of CMP WNL.

## 2023-04-30 NOTE — Progress Notes (Signed)
Uric acid is 6.0.  The goal is to keep uric acid below 6.  Patient should take allopurinol on a regular basis and stick to low purine diet.

## 2023-04-30 NOTE — Progress Notes (Signed)
CBC WNL  UA normal.   Creatinine is elevated-1.58.  GFR is low-50. Please clarify if he is taking NSAIDs? Any other medication changes? Recommend rechecking BMP with GFR in 2 weeks.  Avoid all NSAID use and increase water intake. Rest of CMP WNL.

## 2023-05-12 ENCOUNTER — Encounter: Payer: Self-pay | Admitting: Podiatry

## 2023-05-12 ENCOUNTER — Ambulatory Visit: Payer: 59 | Admitting: Podiatry

## 2023-05-12 ENCOUNTER — Ambulatory Visit (INDEPENDENT_AMBULATORY_CARE_PROVIDER_SITE_OTHER): Payer: 59

## 2023-05-12 DIAGNOSIS — M21611 Bunion of right foot: Secondary | ICD-10-CM | POA: Diagnosis not present

## 2023-05-12 DIAGNOSIS — M778 Other enthesopathies, not elsewhere classified: Secondary | ICD-10-CM

## 2023-05-12 DIAGNOSIS — M2042 Other hammer toe(s) (acquired), left foot: Secondary | ICD-10-CM

## 2023-05-12 DIAGNOSIS — M205X2 Other deformities of toe(s) (acquired), left foot: Secondary | ICD-10-CM

## 2023-05-12 DIAGNOSIS — M2041 Other hammer toe(s) (acquired), right foot: Secondary | ICD-10-CM | POA: Diagnosis not present

## 2023-05-12 DIAGNOSIS — B351 Tinea unguium: Secondary | ICD-10-CM

## 2023-05-12 MED ORDER — EFINACONAZOLE 10 % EX SOLN: 1.0000 [drp] | Freq: Every day | CUTANEOUS | 11 refills | Status: DC

## 2023-05-12 NOTE — Patient Instructions (Addendum)
Toe Fracture Rehab Ask your health care provider which exercises are safe for you. Do exercises exactly as told by your health care provider and adjust them as directed. It is normal to feel mild stretching, pulling, tightness, or discomfort as you do these exercises. Stop right away if you feel sudden pain or your pain gets worse. Do not begin these exercises until told by your health care provider. Stretching and range-of-motion exercises These exercises warm up your muscles and joints and improve the movement and flexibility of your foot. These exercises also help to relieve pain. Plantar flexion with toe extension, seated Sit on a chair that lets your feet rest flat on the floor. Keeping your toes firmly on the ground, lift your left / right heel off the ground (plantar flexion). You should feel a stretch under your toes. Hold this position for __________ seconds. Bring your heel back down to the floor. Repeat __________ times. Complete this exercise __________ times a day. Ankle alphabet  Sit with your left / right leg supported at the lower leg. Do not rest your foot on anything. Make sure your foot has room to move freely. Think of your left / right foot as a paintbrush, and move your foot to trace each letter of the alphabet in the air. Keep your hip and knee still while you trace. Make the letters as large as you can without feeling discomfort. Trace every letter of the alphabet. Repeat __________ times. Complete this exercise __________ times a day. Toe flexion, assisted  Sit with your left / right leg crossed over your opposite knee. Hold your left / right ankle with your hand from the same side of your body. With your other hand, hold your toes. Use the hand that is holding your toes to gently curl your toes down toward the bottom of your foot (assisted flexion). You should feel a stretch on the top of your toes and foot. Hold this position for __________ seconds. Release your  toes and return to the starting position. Repeat __________ times. Complete this exercise __________ times a day. Strengthening exercise This exercise builds strength and endurance in your foot. Endurance is the ability to use your muscles for a long time, even after they get tired. Towel curls  Sit in a chair on a non-carpeted surface, and put your feet on the floor. Place a towel in front of your feet. If told by your health care provider, add a __________ weight to the end of the towel. Keeping your heel on the floor, put your left / right foot on the towel. Pull the towel toward you by grabbing the towel with your toes and curling them under. Keep your heel on the floor while you do this. Let your toes relax. Grab the towel again. Keep pulling the towel until it is completely underneath your foot. Repeat __________ times. Complete this exercise __________ times a day. Balance exercise This exercise will help improve the control of your toe and foot when you are standing or walking. Single leg stand Without shoes, stand near a railing or in a doorway. You may hold on to the railing or door frame as needed for balance. Stand on your left / right foot. Keep your big toe down on the floor and try to keep your arch lifted. Hold this position for __________ seconds. If this exercise is too easy, you can try doing it with your eyes closed or while standing on a pillow. Repeat __________ times. Complete this  exercise __________ times a day. This information is not intended to replace advice given to you by your health care provider. Make sure you discuss any questions you have with your health care provider. Document Revised: 11/02/2020 Document Reviewed: 11/02/2020 Elsevier Patient Education  2023 Else[employer]   Bunion A bunion (hallux valgus) is a bump that forms slowly on the inner side of the big toe joint. It occurs when the big toe turns toward the second toe. Bunions may be small at  first, but they often get larger over time. They can make walking painful. What are the causes? This condition may be caused by: Wearing narrow or pointed shoes that force the big toe to press against the other toes. Abnormal foot development that causes the foot to roll inward. Changes in the foot that are caused by certain diseases, such as rheumatoid arthritis or polio. A foot injury. What increases the risk? The following factors may make you more likely to develop this condition: Wearing shoes that squeeze the toes together. Having certain diseases, such as: Rheumatoid arthritis. Polio. Cerebral palsy. Having family members who have bunions. Being born with abnormally shaped feet (a foot deformity), such as flat feet or low arches. Doing activities that put a lot of pressure on the feet, such as ballet dancing. What are the signs or symptoms?  The main symptom of this condition is a bump on your big toe that you can notice. Other symptoms may include: Pain. Redness and inflammation around your big toe. Thick or hardened skin on your big toe or between your toes. Stiffness or loss of motion in your big toe. Trouble with walking. How is this diagnosed? This condition may be diagnosed based on your symptoms, medical history, and activities. You may also have tests and imaging, such as: X-rays. These allow your health care provider to check the position of the bones in your foot and look for damage to your joint. They also help your health care provider determine the severity of your bunion and the best way to treat it. Joint aspiration. In this test, a sample of fluid is removed from the toe joint. This test may be done if you are in a lot of pain. It helps rule out diseases that cause painful swelling of the joints, such as arthritis or gout. How is this treated? Treatment depends on the severity of your symptoms. The goal of treatment is to relieve symptoms and prevent your bunion  from getting worse. Your health care provider may recommend: Wearing shoes that have a wide toe box, or using bunion pads to cushion the affected area. Taping your toes together to keep them in a normal position. Placing a device inside your shoe (orthotic device) to help reduce pressure on your toe joint. Taking medicine to ease pain and inflammation. Putting ice or heat on the affected area. Doing stretching exercises. Surgery, for severe cases. Follow these instructions at home: Managing pain, stiffness, and swelling     If directed, put ice on the painful area. To do this: Put ice in a plastic bag. Place a towel between your skin and the bag. Leave the ice on for 20 minutes, 2-3 times a day. Remove the ice if your skin turns bright red. This is very important. If you cannot feel pain, heat, or cold, you have a greater risk of damage to the area. If directed, apply heat to the affected area before you exercise. Use the heat source that your health care  provider recommends, such as a moist heat pack or a heating pad. Place a towel between your skin and the heat source. Leave the heat on for 20-30 minutes. Remove the heat if your skin turns bright red. This is especially important if you are unable to feel pain, heat, or cold. You have a greater risk of getting burned. General instructions Do exercises as told by your health care provider. Support your toe joint with proper footwear, shoe padding, or taping as told by your health care provider. Take over-the-counter and prescription medicines only as told by your health care provider. Do not use any products that contain nicotine or tobacco, such as cigarettes, e-cigarettes, and chewing tobacco. If you need help quitting, ask your health care provider. Keep all follow-up visits. This is important. Contact a health care provider if: Your symptoms get worse. Your symptoms do not improve in 2 weeks. Get help right away if: You have  severe pain and trouble with walking. Summary A bunion is a bump on the inner side of the big toe joint that forms when the big toe turns toward the second toe. Bunions can make walking painful. Treatment depends on the severity of your symptoms. Support your toe joint with proper footwear, shoe padding, or taping as told by your health care provider. This information is not intended to replace advice given to you by your health care provider. Make sure you discuss any questions you have with your health care provider. Document Revised: 10/28/2019 Document Reviewed: 10/29/2019 Elsevier Patient Education  2024 Elsevier Inc. Efinaconazole Topical Solution What is this medication? EFINACONAZOLE (e FEE na KON a zole) treats fungal infections of the nails. It belongs to a group of medications called antifungals. It will not treat infections caused by bacteria or viruses. This medicine may be used for other purposes; ask your health care provider or pharmacist if you have questions. COMMON BRAND NAME(S): JUBLIA What should I tell my care team before I take this medication? They need to know if you have any of these conditions: An unusual or allergic reaction to efinaconazole, other medications, foods, dyes or preservatives Pregnant or trying to get pregnant Breast-feeding How should I use this medication? This medication is for external use only. Do not take by mouth. Wash your hands before and after use. Do not get it in your eyes. If you do, rinse your eyes with plenty of cool tap water. Use it as directed on the prescription label. Do not use it more often than directed. Use the medication for the full course as directed by your care team, even if you think you are better. Do not stop using it unless your care team tells you to stop it early. This medication comes with INSTRUCTIONS FOR USE. Ask your pharmacist for directions on how to use this medication. Read the information carefully. Talk to  your pharmacist or care team if you have questions. Talk to your care team about the use of this medication in children. While it may be prescribed for children as young as 6 years for selected conditions, precautions do apply. Overdosage: If you think you have taken too much of this medicine contact a poison control center or emergency room at once. NOTE: This medicine is only for you. Do not share this medicine with others. What if I miss a dose? If you miss a dose, use it as soon as you can. If it is almost time for your next dose, use only that dose. Do  not use double or extra doses. What may interact with this medication? Interactions are not expected. Do not use any other skin products on the same area of skin without talking to your care team. This list may not describe all possible interactions. Give your health care provider a list of all the medicines, herbs, non-prescription drugs, or dietary supplements you use. Also tell them if you smoke, drink alcohol, or use illegal drugs. Some items may interact with your medicine. What should I watch for while using this medication? Visit your care team for regular checks on your progress. It may be some time before you see the benefit from this medication. Do not use nail polish or other nail cosmetic products on the treated nails. What side effects may I notice from receiving this medication? Side effects that you should report to your care team as soon as possible: Allergic reactions--skin rash, itching, hives, swelling of the face, lips, tongue, or throat Side effects that usually do not require medical attention (report to your care team if they continue or are bothersome): Ingrown nails Mild skin irritation, redness, or dryness This list may not describe all possible side effects. Call your doctor for medical advice about side effects. You may report side effects to FDA at 1-800-FDA-1088. Where should I keep my medication? Keep out of the  reach of children and pets. Store at room temperature between 20 and 25 degrees C (68 and 77 degrees F). Do not freeze. Keep the container tightly closed. Get rid of any unused medication after the expiration date. This medication is flammable. Avoid exposure to heat, flame, and smoking. To get rid of medications that are no longer needed or have expired: Take the medication to a medication take-back program. Check with your pharmacy or law enforcement to find a location. If you cannot return the medication, ask your pharmacist or care team how to get rid of this medication safely. NOTE: This sheet is a summary. It may not cover all possible information. If you have questions about this medicine, talk to your doctor, pharmacist, or health care provider.  2024 Elsevier/Gold Standard (2021-09-03 00:00:00)

## 2023-05-12 NOTE — Progress Notes (Unsigned)
Subjective:  Patient ID: Dylan Obrien, male    DOB: 02/05/1966,  MRN: [id number]  Chief Complaint  Patient presents with   Bunions   Hammer Toe    Bilateral hammer toes issues     Discussed the use of AI scribe software for clinical note transcription with the patient, who gave verbal consent to proceed.  History of Present Illness         57 year old with a history of gout, presents with a long-standing issue of bunion and hammer toe. Over the past year, the condition has worsened, with the first and second toes on the right foot starting to cross over each other. This has led to discomfort and pain, particularly impacting the nails. The patient has been managing the condition based on online information and has not received any formal treatment. The patient also reports having high arches, which may be impacting the condition. The patient is interested in exploring daily routine measures to mitigate the progression of the condition.     Objective:    Physical Exam         General: AAO x3, NAD  Dermatological: Hallux toenail is hypertrophic, dystrophic with yellow discoloration.  There is no edema, erythema or signs of infection.  There are no open lesions.  Vascular: Dorsalis Pedis artery and Posterior Tibial artery pedal pulses are 2/4 bilateral with immedate capillary fill time. There is no pain with calf compression, swelling, warmth, erythema.   Neruologic: Grossly intact via light touch bilateral.   Musculoskeletal: On the left foot there is decreased dorsiflexion of the first MTPJ.  On the right foot moderate bunions present with decreased range of motion of first MPJ with hammertoe contracture.  Unable to appreciate any area of pinpoint tenderness today.  There is no edema or erythema. Gait: Unassisted, Nonantalgic.    No images are attached to the encounter.    Results          Assessment:   1. Capsulitis of foot      Plan:  Patient was evaluated and  treated and all questions answered.  Assessment and Plan           Bunion and Hammertoe Progressive deformity with overlapping toes and nail issues. No current pain or swelling. Discussed the structural nature of the deformity and the progressive nature of the condition. No prior treatment. -We discussed both conservative as well as surgical intervention. -Consider orthotics to help slow progression and alleviate pressure.  Discussed this is not going to cause the toes to straighten but can help with his arches and hopefully help slow the progression. -Refer to pedorthist for shoe and insert recommendations.  He was evaluated today -Advise on appropriate shoe types (wider toe box, soft/mesh material, extra depth). -Provide exercises for toe mobility and strength (Toe Yoga, towel bunching, marble pick-up). -Dispensed a single limb toe regulator to help hold the second toe down the right foot.  Onychomycosis Possible fungal infection of the nails. Discussed treatment options including oral and topical antifungals. -Prescribe topical antifungal (Jublia) from [facility] pharmacy.  Radiology: 2 views of bilateral feet were obtained.  There is no evidence of acute fracture noted.  On the left foot there is spurring present to dorsal first metatarsal.  Right foot bunion is present with joint space narrowing first MTPJ.  Hammertoe contracture present.   Return for orthotic pick-up.    ---  Orthotic eval   Patient was seen, measured / scanned for custom molded foot orthotics.  Patient will  benefit from CFO's as they will help provide total contact to MLA's helping to better distribute body weight across BIL feet greater reducing plantar pressure and pain and to also encourage FF and RF alignment.  Patient was scanned items to be ordered and fit when in  [employer], CFo, CFm

## 2023-05-28 ENCOUNTER — Other Ambulatory Visit: Payer: Self-pay | Admitting: *Deleted

## 2023-05-28 DIAGNOSIS — Z5181 Encounter for therapeutic drug level monitoring: Secondary | ICD-10-CM

## 2023-05-29 LAB — BASIC METABOLIC PANEL WITH GFR
BUN/Creatinine Ratio: 23 (calc) — ABNORMAL HIGH (ref 6–22)
BUN: 26 mg/dL — ABNORMAL HIGH (ref 7–25)
CO2: 30 mmol/L (ref 20–32)
Calcium: 9.6 mg/dL (ref 8.6–10.3)
Chloride: 102 mmol/L (ref 98–110)
Creat: 1.14 mg/dL (ref 0.70–1.30)
Glucose, Bld: 104 mg/dL — ABNORMAL HIGH (ref 65–99)
Potassium: 4.2 mmol/L (ref 3.5–5.3)
Sodium: 139 mmol/L (ref 135–146)
eGFR: 75 mL/min/{1.73_m2} (ref >=60)

## 2023-05-29 NOTE — Progress Notes (Signed)
Creatinine has improved significantly--1.14 down from 1.58.  GFR has also improved from 50 to 75.

## 2023-06-10 ENCOUNTER — Other Ambulatory Visit: Payer: Self-pay | Admitting: Physician Assistant

## 2023-06-10 NOTE — Telephone Encounter (Signed)
Last Fill: 03/06/2023  Labs: 04/29/2023 CBC WNL Creatinine is elevated-1.58.  GFR is low-50. Rest of CMP WNL.   05/28/2023 Creatinine has improved significantly--1.14 down from 1.58.  GFR has also improved from 50 to 75.   Next Visit: 09/29/2023  Last Visit: 04/30/2023  DX:  Idiopathic chronic gout of multiple sites without tophus   Current Dose per office note 04/30/2023: allopurinol 450mg  by mouth daily    Okay to refill Allopurinol?

## 2023-06-25 ENCOUNTER — Other Ambulatory Visit: Payer: 59

## 2023-07-22 ENCOUNTER — Telehealth: Payer: Self-pay

## 2023-07-22 ENCOUNTER — Other Ambulatory Visit: Payer: Self-pay | Admitting: Podiatry

## 2023-07-22 MED ORDER — EFINACONAZOLE 10 % EX SOLN: 1.0000 [drp] | Freq: Every day | CUTANEOUS | 11 refills | Status: AC

## 2023-07-22 NOTE — Telephone Encounter (Signed)
 Patient called and requested refill of Jublia, send it to [facility] pharmacy - thanks

## 2023-07-23 ENCOUNTER — Ambulatory Visit (INDEPENDENT_AMBULATORY_CARE_PROVIDER_SITE_OTHER): Payer: 59

## 2023-07-23 DIAGNOSIS — M21611 Bunion of right foot: Secondary | ICD-10-CM | POA: Diagnosis not present

## 2023-07-23 DIAGNOSIS — M778 Other enthesopathies, not elsewhere classified: Secondary | ICD-10-CM | POA: Diagnosis not present

## 2023-07-23 DIAGNOSIS — M2041 Other hammer toe(s) (acquired), right foot: Secondary | ICD-10-CM

## 2023-07-23 DIAGNOSIS — M2042 Other hammer toe(s) (acquired), left foot: Secondary | ICD-10-CM

## 2023-07-23 DIAGNOSIS — M205X2 Other deformities of toe(s) (acquired), left foot: Secondary | ICD-10-CM

## 2023-07-23 DIAGNOSIS — M2142 Flat foot [pes planus] (acquired), left foot: Secondary | ICD-10-CM

## 2023-07-23 DIAGNOSIS — M2141 Flat foot [pes planus] (acquired), right foot: Secondary | ICD-10-CM

## 2023-07-23 NOTE — Progress Notes (Signed)
 Patient presents today to pick up custom molded foot orthotics, diagnosed with Hammertoes BIL Pes Planus, Bunion, Capsulitis and Hallux limitus by Dr. Clydia Dart.   Orthotics were dispensed and fit was satisfactory. Reviewed instructions for break-in and wear. Written instructions given to patient.  Patient will follow up as needed. Britton Cane CPed, CFo, CFm

## 2023-08-20 ENCOUNTER — Telehealth: Payer: Self-pay | Admitting: Rheumatology

## 2023-08-20 NOTE — Telephone Encounter (Signed)
Patient contacted the office to request a medication refill.   1. Name of Medication: Allopurinol  2. How are you currently taking this medication (dosage and times per day)? I 1/2 tablet in the AM    3. What pharmacy would you like for that to be sent to? [facility] [address]

## 2023-08-20 NOTE — Telephone Encounter (Signed)
Patient advised prescription was sent to the pharmacy on June 10, 2023 for a 90 day supply. Patient states he will go to the pharmacy to check with pharmacist.

## 2023-09-15 NOTE — Progress Notes (Signed)
 Office Visit Note  Patient: Dylan Obrien             Date of Birth: 1965-08-22           MRN: [id number]             PCP: Garlan Fillers, MD Referring: Garlan Fillers, MD Visit Date: 09/29/2023 Occupation: @GUAROCC @  Subjective:  Medication monitoring  History of Present Illness: Dylan Obrien is a 58 y.o. male with history of gout and osteoarthritis.  Patient remains on allopurinol 450mg  by mouth daily and colchicine 0.6mg  by mouth daily as needed.  He is tolerating now without any side effects and has not had any recent gaps in therapy.  He denies any signs or symptoms of a gout flare recently.  Patient states that he has occasional discomfort in his knee joints but overall has been more sedentary so has not been performing any overuse or repetitive activities with his knee joints.  Patient had bilateral Visco supplementation performed for both knees in December 2023.  He denies any joint swelling at this time.  He denies any new medical conditions.   Activities of Daily Living:  Patient reports morning stiffness for 0 minutes.   Patient Denies nocturnal pain.  Difficulty dressing/grooming: Denies Difficulty climbing stairs: Reports Difficulty getting out of chair: Denies Difficulty using hands for taps, buttons, cutlery, and/or writing: Reports  Review of Systems  Constitutional:  Negative for fatigue.  HENT:  Negative for mouth sores, mouth dryness and nose dryness.   Eyes:  Negative for pain and dryness.  Respiratory:  Negative for shortness of breath.   Cardiovascular:  Negative for chest pain and palpitations.  Gastrointestinal:  Negative for blood in stool, constipation and diarrhea.  Endocrine: Negative for increased urination.  Genitourinary:  Negative for involuntary urination.  Musculoskeletal:  Positive for joint pain and joint pain. Negative for gait problem, joint swelling, myalgias, muscle weakness, morning stiffness, muscle tenderness and myalgias.   Skin:  Negative for color change, rash and sensitivity to sunlight.  Allergic/Immunologic: Negative for susceptible to infections.  Neurological:  Negative for dizziness, numbness and headaches.  Hematological:  Negative for swollen glands.  Psychiatric/Behavioral:  Negative for depressed mood and sleep disturbance. The patient is not nervous/anxious.     PMFS History:  Patient Active Problem List   Diagnosis Date Noted   Primary osteoarthritis of both knees 10/14/2016   Primary osteoarthritis of both hands 10/14/2016   Gout    Hx of cardiovascular stress test    Chest pain 04/23/2012    Past Medical History:  Diagnosis Date   Allergy    Arthritis    Chest pain 04/23/2012   Gout    Hx of cardiovascular stress test    a. ETT 11/13 with borderline ST changes => b. ETT-Myoview:  1mm ST depression V3-6 on ECG (false +), images with no ischemia, EF 58%   Sleep apnea     Family History  Problem Relation Age of Onset   Breast cancer Mother    Anemia Father    Cancer Sister        brain   Colon cancer Neg Hx    Esophageal cancer Neg Hx    Rectal cancer Neg Hx    Stomach cancer Neg Hx    Past Surgical History:  Procedure Laterality Date   EXTERNAL FIXATION ARM  07/09/1987   ORIF WRIST FRACTURE Left    ROTATOR CUFF REPAIR Right 12/2020   Social History  Social History Narrative   Caffeine: 4 cups daily.   Education: Medical laboratory scientific officer, work: Doctor, hospital.    Immunization History  Administered Date(s) Administered   Unspecified SARS-COV-2 Vaccination 10/07/2019, 10/28/2019     Objective: Vital Signs: BP (!) 142/81 (BP Location: Left Arm, Patient Position: Sitting, Cuff Size: Large)   Pulse (!) 40   Resp 15   Ht 6\' 2"  (1.88 m)   Wt 272 lb 6.4 oz (123.6 kg)   BMI 34.97 kg/m    Physical Exam Vitals and nursing note reviewed.  Constitutional:      Appearance: He is well-developed.  HENT:     Head: Normocephalic and atraumatic.  Eyes:      Conjunctiva/sclera: Conjunctivae normal.     Pupils: Pupils are equal, round, and reactive to light.  Cardiovascular:     Rate and Rhythm: Normal rate and regular rhythm.     Heart sounds: Normal heart sounds.  Pulmonary:     Effort: Pulmonary effort is normal.     Breath sounds: Normal breath sounds.  Abdominal:     General: Bowel sounds are normal.     Palpations: Abdomen is soft.  Musculoskeletal:     Cervical back: Normal range of motion and neck supple.  Skin:    General: Skin is warm and dry.     Capillary Refill: Capillary refill takes less than 2 seconds.  Neurological:     Mental Status: He is alert and oriented to person, place, and time.  Psychiatric:        Behavior: Behavior normal.      Musculoskeletal Exam: C-spine, thoracic spine, lumbar spine and good range of motion.  Shoulder joints, elbow joints, wrist joints, MCPs, PIPs, DIPs have good range of motion with no synovitis.  Complete fist formation bilaterally.  Hip joints have good range of motion with no groin pain.  Knee joints have good range of motion with crepitus in the left knee.  No warmth or effusion noted.  Ankle joints have good range of motion with no tenderness or joint swelling.  CDAI Exam: CDAI Score: -- Patient Global: --; Provider Global: -- Swollen: --; Tender: -- Joint Exam 09/29/2023   No joint exam has been documented for this visit   There is currently no information documented on the homunculus. Go to the Rheumatology activity and complete the homunculus joint exam.  Investigation: No additional findings.  Imaging: No results found.  Recent Labs: Lab Results  Component Value Date   WBC 6.8 04/29/2023   HGB 14.1 04/29/2023   PLT 216 04/29/2023   NA 139 05/28/2023   K 4.2 05/28/2023   CL 102 05/28/2023   CO2 30 05/28/2023   GLUCOSE 104 (H) 05/28/2023   BUN 26 (H) 05/28/2023   CREATININE 1.14 05/28/2023   BILITOT 0.6 04/29/2023   ALKPHOS 40 08/09/2019   AST 16 04/29/2023    ALT 18 04/29/2023   PROT 6.9 04/29/2023   ALBUMIN 3.3 (L) 08/09/2019   CALCIUM 9.6 05/28/2023   GFRAA 79 06/06/2020    Speciality Comments: No specialty comments available.  Procedures:  No procedures performed Allergies: Egg-derived products    Assessment / Plan:     Visit Diagnoses: Idiopathic chronic gout of multiple sites without tophus - He has not had any signs or symptoms of a gout flare. No inflammation was noted on examination today. He has clinically been doing well taking allopurinol 450 mg daily.  He continues to tolerate allopurinol without any side effects and has  not had any recent gaps in therapy.  He has not needed to take colchicine since prior to his last office visit.  His uric acid level was 6.0 on 04/29/2023.  Plan to update CBC, CMP, and uric acid level today.  A refill of allopurinol will be sent to the pharmacy today.  He was advised to notify us if he develops signs or symptoms of a flare.  He will follow-up in the office in 6 months or sooner if needed.   - Plan: Uric acid  Medication monitoring encounter - Uric acid is 6.0 on 04/29/23.   CBC and CMP updated on 04/29/23.  Plan to update CBC, CMP, and uric acid today. Plan: CBC with Differential/Platelet, COMPLETE METABOLIC PANEL WITH GFR, Uric acid  Elevated serum creatinine -Creatinine was 1.14 and GFR was 75 on 05/28/2023.  CMP with GFR updated today.  Plan: COMPLETE METABOLIC PANEL WITH GFR  Primary osteoarthritis of both hands: He has PIP and DIP thickening consistent with osteoarthritis of both hands.  No synovitis noted.  Primary osteoarthritis of both knees - s/p euflexxa bilateral knees 06/2022.  Crepitus noted with range of motion of the left knee.  No warmth or effusion noted.  He was advised to notify us when and if he would like to reapply for viscosupplementation for both knees.  Other medical conditions are listed as follows:  Primary osteoarthritis of both feet: Wearing orthotics which are  helpful.  Hammer toes of both feet: Evaluated by Dr. Loreta Ave.  Patient was fitted for orthotics which have been helpful.  Rosacea  Orders: Orders Placed This Encounter  Procedures   CBC with Differential/Platelet   COMPLETE METABOLIC PANEL WITH GFR   Uric acid   Meds ordered this encounter  Medications   allopurinol (ZYLOPRIM) 300 MG tablet    Sig: Take 1.5 tablets (450 mg total) by mouth daily.    Dispense:  135 tablet    Refill:  0      Follow-Up Instructions: Return in about 6 months (around 03/31/2024) for Gout, Osteoarthritis.   Gearldine Bienenstock, PA-C  Note - This record has been created using Dragon software.  Chart creation errors have been sought, but may not always  have been located. Such creation errors do not reflect on  the standard of medical care.

## 2023-09-22 ENCOUNTER — Telehealth: Payer: Self-pay

## 2023-09-22 NOTE — Telephone Encounter (Signed)
 Patient called requesting re-order of Fo's  2nd pair half off will collect $271.00 patient can pu when in does not need appt.

## 2023-09-29 ENCOUNTER — Ambulatory Visit: Payer: 59 | Attending: Physician Assistant | Admitting: Physician Assistant

## 2023-09-29 ENCOUNTER — Encounter: Payer: Self-pay | Admitting: Physician Assistant

## 2023-09-29 VITALS — BP 142/81 | HR 40 | Resp 15 | Ht 74.0 in | Wt 272.4 lb

## 2023-09-29 DIAGNOSIS — M2041 Other hammer toe(s) (acquired), right foot: Secondary | ICD-10-CM

## 2023-09-29 DIAGNOSIS — R7989 Other specified abnormal findings of blood chemistry: Secondary | ICD-10-CM | POA: Diagnosis not present

## 2023-09-29 DIAGNOSIS — M170 Bilateral primary osteoarthritis of knee: Secondary | ICD-10-CM

## 2023-09-29 DIAGNOSIS — Z5181 Encounter for therapeutic drug level monitoring: Secondary | ICD-10-CM | POA: Diagnosis not present

## 2023-09-29 DIAGNOSIS — M1A09X0 Idiopathic chronic gout, multiple sites, without tophus (tophi): Secondary | ICD-10-CM

## 2023-09-29 DIAGNOSIS — L719 Rosacea, unspecified: Secondary | ICD-10-CM

## 2023-09-29 DIAGNOSIS — M19042 Primary osteoarthritis, left hand: Secondary | ICD-10-CM

## 2023-09-29 DIAGNOSIS — M19071 Primary osteoarthritis, right ankle and foot: Secondary | ICD-10-CM

## 2023-09-29 DIAGNOSIS — M2042 Other hammer toe(s) (acquired), left foot: Secondary | ICD-10-CM

## 2023-09-29 DIAGNOSIS — M19041 Primary osteoarthritis, right hand: Secondary | ICD-10-CM

## 2023-09-29 DIAGNOSIS — M19072 Primary osteoarthritis, left ankle and foot: Secondary | ICD-10-CM

## 2023-09-29 MED ORDER — ALLOPURINOL 300 MG PO TABS: 450.0000 mg | ORAL_TABLET | Freq: Every day | ORAL | 0 refills | Status: DC

## 2023-09-30 LAB — COMPLETE METABOLIC PANEL WITH GFR
AG Ratio: 1.8 (calc) (ref 1.0–2.5)
ALT: 18 U/L (ref 9–46)
AST: 18 U/L (ref 10–35)
Albumin: 4.4 g/dL (ref 3.6–5.1)
Alkaline phosphatase (APISO): 51 U/L (ref 35–144)
BUN/Creatinine Ratio: 21 (calc) (ref 6–22)
BUN: 26 mg/dL — ABNORMAL HIGH (ref 7–25)
CO2: 27 mmol/L (ref 20–32)
Calcium: 9.2 mg/dL (ref 8.6–10.3)
Chloride: 103 mmol/L (ref 98–110)
Creat: 1.23 mg/dL (ref 0.70–1.30)
Globulin: 2.4 g/dL (ref 1.9–3.7)
Glucose, Bld: 85 mg/dL (ref 65–99)
Potassium: 4.1 mmol/L (ref 3.5–5.3)
Sodium: 139 mmol/L (ref 135–146)
Total Bilirubin: 0.7 mg/dL (ref 0.2–1.2)
Total Protein: 6.8 g/dL (ref 6.1–8.1)

## 2023-09-30 LAB — CBC WITH DIFFERENTIAL/PLATELET
Absolute Lymphocytes: 1525 {cells}/uL (ref 850–3900)
Absolute Monocytes: 564 {cells}/uL (ref 200–950)
Basophils Absolute: 37 {cells}/uL (ref 0–200)
Basophils Relative: 0.6 %
Eosinophils Absolute: 229 {cells}/uL (ref 15–500)
Eosinophils Relative: 3.7 %
HCT: 41.4 % (ref 38.5–50.0)
Hemoglobin: 14.2 g/dL (ref 13.2–17.1)
MCH: 31.1 pg (ref 27.0–33.0)
MCHC: 34.3 g/dL (ref 32.0–36.0)
MCV: 90.8 fL (ref 80.0–100.0)
MPV: 10.7 fL (ref 7.5–12.5)
Monocytes Relative: 9.1 %
Neutro Abs: 3844 {cells}/uL (ref 1500–7800)
Neutrophils Relative %: 62 %
Platelets: 216 10*3/uL (ref 140–400)
RBC: 4.56 10*6/uL (ref 4.20–5.80)
RDW: 12.9 % (ref 11.0–15.0)
Total Lymphocyte: 24.6 %
WBC: 6.2 10*3/uL (ref 3.8–10.8)

## 2023-09-30 LAB — URIC ACID: Uric Acid, Serum: 6.7 mg/dL (ref 4.0–8.0)

## 2023-09-30 NOTE — Progress Notes (Signed)
 BUN remains borderline elevated but stable. Rest of CMP WNL CBC WNL Uric acid-6.7.  please clarify if the patient has been taking allopurinol 450 mg daily as prescribed?

## 2023-10-02 ENCOUNTER — Telehealth: Payer: Self-pay

## 2023-10-02 DIAGNOSIS — M79673 Pain in unspecified foot: Secondary | ICD-10-CM

## 2023-10-02 NOTE — Telephone Encounter (Signed)
 CALLED PT TO LET HIM KNOW HE CAN PICK UP HIS 2ND PAIR ITLL BE 271

## 2024-01-03 ENCOUNTER — Other Ambulatory Visit: Payer: Self-pay | Admitting: Physician Assistant

## 2024-01-05 NOTE — Telephone Encounter (Signed)
 Last Fill: 09/29/2023  Labs: 09/29/2023 BUN remains borderline elevated but stable. Rest of CMP WNL  CBC WNL  Uric acid-6.7.   Next Visit: 04/01/2024  Last Visit: 09/29/2023  DX: Idiopathic chronic gout of multiple sites without tophus   Current Dose per office note 09/29/2023: allopurinol  450 mg daily   Okay to refill Allopurinol ?

## 2024-03-19 NOTE — Progress Notes (Signed)
 Office Visit Note  Patient: Dylan Obrien             Date of Birth: [birth year]           MRN: [id number]             PCP: Yolande Toribio MATSU, MD Referring: Yolande Toribio MATSU, MD Visit Date: 04/01/2024 Occupation: Data Unavailable  Subjective:  Medication management  History of Present Illness: Dylan Obrien is a 58 y.o. male with gout and osteoarthritis.  He returns today after his last visit in March 2025.  He states he has not had a gout flare.  He had labs this morning.  He gets occasional discomfort in his knee joints.  He has not had viscosupplement injections since 2023.  He states he still consumes red meat and shrimp.  He also drinks about 6 alcoholic beverages per week.    Activities of Daily Living:  Patient reports morning stiffness for 0 minute.   Patient Reports nocturnal pain.  Difficulty dressing/grooming: Denies Difficulty climbing stairs: Denies Difficulty getting out of chair: Denies Difficulty using hands for taps, buttons, cutlery, and/or writing: Denies  Review of Systems  Constitutional:  Negative for fatigue.  HENT:  Negative for mouth sores and mouth dryness.   Eyes:  Negative for dryness.  Respiratory:  Negative for shortness of breath.   Cardiovascular:  Negative for chest pain and palpitations.  Gastrointestinal:  Negative for blood in stool, constipation and diarrhea.  Endocrine: Negative for increased urination.  Genitourinary:  Negative for involuntary urination.  Musculoskeletal:  Positive for joint pain and joint pain. Negative for gait problem, joint swelling, myalgias, muscle weakness, morning stiffness, muscle tenderness and myalgias.  Skin:  Negative for color change, rash, hair loss and sensitivity to sunlight.  Allergic/Immunologic: Negative for susceptible to infections.  Neurological:  Negative for dizziness and headaches.  Hematological:  Negative for swollen glands.  Psychiatric/Behavioral:  Negative for depressed mood and  sleep disturbance. The patient is not nervous/anxious.     PMFS History:  Patient Active Problem List   Diagnosis Date Noted   Primary osteoarthritis of both knees 10/14/2016   Primary osteoarthritis of both hands 10/14/2016   Gout    Hx of cardiovascular stress test    Chest pain 04/23/2012    Past Medical History:  Diagnosis Date   Allergy    Arthritis    Chest pain 04/23/2012   Gout    Hx of cardiovascular stress test    a. ETT 11/13 with borderline ST changes => b. ETT-Myoview:  1mm ST depression V3-6 on ECG (false +), images with no ischemia, EF 58%   Sleep apnea     Family History  Problem Relation Age of Onset   Breast cancer Mother    Anemia Father    Cancer Sister        brain   Colon cancer Neg Hx    Esophageal cancer Neg Hx    Rectal cancer Neg Hx    Stomach cancer Neg Hx    Past Surgical History:  Procedure Laterality Date   EXTERNAL FIXATION ARM  07/09/1987   ORIF WRIST FRACTURE Left    ROTATOR CUFF REPAIR Right 12/2020   Social History   Tobacco Use   Smoking status: Never    Passive exposure: Never   Smokeless tobacco: Never  Vaping Use   Vaping status: Never Used  Substance Use Topics   Alcohol use: Yes    Comment: ocassionally   Drug use: No  Social History   Social History Narrative   Caffeine: 4 cups daily.   Education: Medical laboratory scientific officer, work: Doctor, hospital.      Immunization History  Administered Date(s) Administered   Unspecified SARS-COV-2 Vaccination 10/07/2019, 10/28/2019     Objective: Vital Signs: BP 134/83 (BP Location: Left Arm, Patient Position: Sitting, Cuff Size: Normal)   Pulse (!) 49   Temp 98 F (36.7 C)   Resp 14   Ht 6' 2 (1.88 m)   Wt 270 lb (122.5 kg)   BMI 34.67 kg/m    Physical Exam Vitals and nursing note reviewed.  Constitutional:      Appearance: He is well-developed.  HENT:     Head: Normocephalic and atraumatic.  Eyes:     Conjunctiva/sclera: Conjunctivae normal.     Pupils:  Pupils are equal, round, and reactive to light.  Cardiovascular:     Rate and Rhythm: Normal rate and regular rhythm.     Heart sounds: Normal heart sounds.  Pulmonary:     Effort: Pulmonary effort is normal.     Breath sounds: Normal breath sounds.  Abdominal:     General: Bowel sounds are normal.     Palpations: Abdomen is soft.  Musculoskeletal:     Cervical back: Normal range of motion and neck supple.  Skin:    General: Skin is warm and dry.     Capillary Refill: Capillary refill takes less than 2 seconds.  Neurological:     Mental Status: He is alert and oriented to person, place, and time.  Psychiatric:        Behavior: Behavior normal.      Musculoskeletal Exam: Cervical, thoracic and lumbar spine Juengel range of motion.  Shoulders, elbows, wrist joints, MCPs PIPs and DIPs with good range of motion.  He had bilateral DIP thickening.  Hip joints and knee joints in good range of motion.  There was no tenderness over ankles or MTPs.  CDAI Exam: CDAI Score: -- Patient Global: --; Provider Global: -- Swollen: --; Tender: -- Joint Exam 04/01/2024   No joint exam has been documented for this visit   There is currently no information documented on the homunculus. Go to the Rheumatology activity and complete the homunculus joint exam.  Investigation: No additional findings.  Imaging: No results found.  Recent Labs: Lab Results  Component Value Date   WBC 6.2 09/29/2023   HGB 14.2 09/29/2023   PLT 216 09/29/2023   NA 139 09/29/2023   K 4.1 09/29/2023   CL 103 09/29/2023   CO2 27 09/29/2023   GLUCOSE 85 09/29/2023   BUN 26 (H) 09/29/2023   CREATININE 1.23 09/29/2023   BILITOT 0.7 09/29/2023   ALKPHOS 40 08/09/2019   AST 18 09/29/2023   ALT 18 09/29/2023   PROT 6.8 09/29/2023   ALBUMIN 3.3 (L) 08/09/2019   CALCIUM 9.2 09/29/2023   GFRAA 79 06/06/2020    Speciality Comments: No specialty comments available.  Procedures:  No procedures  performed Allergies: Egg-derived products   Assessment / Plan:     Visit Diagnoses: Idiopathic chronic gout of multiple sites without tophus - allopurinol  450 mg daily. uric acid: 6.7 on 09/29/2023 -patient denies having a gout flare since the last visit.  His uric acid is slightly higher than before but is still in the normal range.  He has not taken colchicine  in several years but wanted a refill today.  He has not been watching his diet.  He states he consumes red meat  and shellfish.  He also drinks beer daily.  Dietary modifications and cutting back on alcohol was discussed at length.  Prescription refill for Medicare and allopurinol  was sent.  Plan: Colchicine  (MITIGARE ) 0.6 MG CAPS, allopurinol  (ZYLOPRIM ) 300 MG tablet  Medication monitoring encounter-he had CBC, CMP and uric acid drawn today.  Labs are pending.  Primary osteoarthritis of both hands-he has some DIP and PIP thickening without blood symptoms.  Primary osteoarthritis of both knees - s/p euflexxa bilateral knees 06/2022.  He denies any discomfort in his knee joints today.  However he reports intermittent discomfort.  Primary osteoarthritis of both feet-patient states he was evaluated by Dr. Gershon for hammertoes and osteoarthritis.  He has been wearing shoe inserts which has been helpful.  Hammer toes of both feet - Evaluated by Dr. Alona.  Rosacea  BMI 34.0-34.9,adult-weight loss diet and exercise was emphasized.  Orders: No orders of the defined types were placed in this encounter.  Meds ordered this encounter  Medications   Colchicine  (MITIGARE ) 0.6 MG CAPS    Sig: Take 1 capsule (0.6 mg total) by mouth daily as needed.    Dispense:  30 capsule    Refill:  1   allopurinol  (ZYLOPRIM ) 300 MG tablet    Sig: Take 1.5 tablets (450 mg total) by mouth daily.    Dispense:  135 tablet    Refill:  1     Follow-Up Instructions: Return in about 6 months (around 09/29/2024) for Gout, Osteoarthritis.   Maya Nash,  MD  Note - This record has been created using Animal nutritionist.  Chart creation errors have been sought, but may not always  have been located. Such creation errors do not reflect on  the standard of medical care.

## 2024-04-01 ENCOUNTER — Ambulatory Visit: Attending: Rheumatology | Admitting: Rheumatology

## 2024-04-01 ENCOUNTER — Encounter: Payer: Self-pay | Admitting: Rheumatology

## 2024-04-01 ENCOUNTER — Other Ambulatory Visit: Payer: Self-pay

## 2024-04-01 VITALS — BP 134/83 | HR 49 | Temp 98.0°F | Resp 14 | Ht 74.0 in | Wt 270.0 lb

## 2024-04-01 DIAGNOSIS — M1A09X0 Idiopathic chronic gout, multiple sites, without tophus (tophi): Secondary | ICD-10-CM

## 2024-04-01 DIAGNOSIS — M19041 Primary osteoarthritis, right hand: Secondary | ICD-10-CM

## 2024-04-01 DIAGNOSIS — M19072 Primary osteoarthritis, left ankle and foot: Secondary | ICD-10-CM

## 2024-04-01 DIAGNOSIS — Z5181 Encounter for therapeutic drug level monitoring: Secondary | ICD-10-CM

## 2024-04-01 DIAGNOSIS — M19071 Primary osteoarthritis, right ankle and foot: Secondary | ICD-10-CM

## 2024-04-01 DIAGNOSIS — Z6834 Body mass index (BMI) 34.0-34.9, adult: Secondary | ICD-10-CM

## 2024-04-01 DIAGNOSIS — M170 Bilateral primary osteoarthritis of knee: Secondary | ICD-10-CM

## 2024-04-01 DIAGNOSIS — M2042 Other hammer toe(s) (acquired), left foot: Secondary | ICD-10-CM

## 2024-04-01 DIAGNOSIS — R7989 Other specified abnormal findings of blood chemistry: Secondary | ICD-10-CM

## 2024-04-01 DIAGNOSIS — L719 Rosacea, unspecified: Secondary | ICD-10-CM

## 2024-04-01 DIAGNOSIS — M19042 Primary osteoarthritis, left hand: Secondary | ICD-10-CM

## 2024-04-01 DIAGNOSIS — M2041 Other hammer toe(s) (acquired), right foot: Secondary | ICD-10-CM

## 2024-04-01 LAB — COMPREHENSIVE METABOLIC PANEL WITH GFR
AG Ratio: 1.8 (calc) (ref 1.0–2.5)
ALT: 16 U/L (ref 9–46)
AST: 15 U/L (ref 10–35)
Albumin: 4.2 g/dL (ref 3.6–5.1)
Alkaline phosphatase (APISO): 45 U/L (ref 35–144)
BUN: 21 mg/dL (ref 7–25)
CO2: 27 mmol/L (ref 20–32)
Calcium: 9.2 mg/dL (ref 8.6–10.3)
Chloride: 105 mmol/L (ref 98–110)
Creat: 1.03 mg/dL (ref 0.70–1.30)
Globulin: 2.3 g/dL (ref 1.9–3.7)
Glucose, Bld: 114 mg/dL — ABNORMAL HIGH (ref 65–99)
Potassium: 4.5 mmol/L (ref 3.5–5.3)
Sodium: 140 mmol/L (ref 135–146)
Total Bilirubin: 0.6 mg/dL (ref 0.2–1.2)
Total Protein: 6.5 g/dL (ref 6.1–8.1)
eGFR: 84 mL/min/1.73m2 (ref >=60)

## 2024-04-01 LAB — CBC WITH DIFFERENTIAL/PLATELET
Absolute Lymphocytes: 1214 {cells}/uL (ref 850–3900)
Absolute Monocytes: 423 {cells}/uL (ref 200–950)
Basophils Absolute: 31 {cells}/uL (ref 0–200)
Basophils Relative: 0.6 %
Eosinophils Absolute: 194 {cells}/uL (ref 15–500)
Eosinophils Relative: 3.8 %
HCT: 40.9 % (ref 38.5–50.0)
Hemoglobin: 13.7 g/dL (ref 13.2–17.1)
MCH: 31.4 pg (ref 27.0–33.0)
MCHC: 33.5 g/dL (ref 32.0–36.0)
MCV: 93.8 fL (ref 80.0–100.0)
MPV: 10.6 fL (ref 7.5–12.5)
Monocytes Relative: 8.3 %
Neutro Abs: 3239 {cells}/uL (ref 1500–7800)
Neutrophils Relative %: 63.5 %
Platelets: 198 Thousand/uL (ref 140–400)
RBC: 4.36 Million/uL (ref 4.20–5.80)
RDW: 13 % (ref 11.0–15.0)
Total Lymphocyte: 23.8 %
WBC: 5.1 Thousand/uL (ref 3.8–10.8)

## 2024-04-01 LAB — URIC ACID: Uric Acid, Serum: 6.8 mg/dL (ref 4.0–8.0)

## 2024-04-01 MED ORDER — COLCHICINE 0.6 MG PO CAPS: 0.6000 mg | ORAL_CAPSULE | Freq: Every day | ORAL | 1 refills | Status: AC | PRN

## 2024-04-01 MED ORDER — ALLOPURINOL 300 MG PO TABS: 450.0000 mg | ORAL_TABLET | Freq: Every day | ORAL | 1 refills | Status: AC

## 2024-04-01 NOTE — Patient Instructions (Signed)

## 2024-04-02 ENCOUNTER — Ambulatory Visit: Payer: Self-pay | Admitting: Rheumatology

## 2024-04-02 NOTE — Progress Notes (Signed)
 CBC and CMP are normal except glucose is mildly elevated probably not a fasting sample.  Uric acid is 6.8 which is stable.

## 2024-05-14 ENCOUNTER — Other Ambulatory Visit: Payer: Self-pay | Admitting: Rheumatology

## 2024-05-14 DIAGNOSIS — M1A09X0 Idiopathic chronic gout, multiple sites, without tophus (tophi): Secondary | ICD-10-CM

## 2024-09-29 ENCOUNTER — Ambulatory Visit: Admitting: Rheumatology
